# Patient Record
Sex: Female | Born: 1965 | Race: Asian | Hispanic: No | Marital: Married | Smoking: Never smoker
Health system: Southern US, Community
[De-identification: ages and names within clinical notes are randomized; demographics above are authoritative.]

## PROBLEM LIST (undated history)

## (undated) DIAGNOSIS — I10 Essential (primary) hypertension: Secondary | ICD-10-CM

## (undated) DIAGNOSIS — E119 Type 2 diabetes mellitus without complications: Secondary | ICD-10-CM

## (undated) HISTORY — DX: Type 2 diabetes mellitus without complications: E11.9

## (undated) HISTORY — DX: Essential (primary) hypertension: I10

---

## 2017-08-23 HISTORY — PX: CHOLECYSTECTOMY: SHX55

## 2017-08-23 HISTORY — PX: HERNIA REPAIR: SHX51

## 2019-10-23 ENCOUNTER — Other Ambulatory Visit: Payer: Self-pay

## 2019-10-23 ENCOUNTER — Encounter: Payer: Self-pay | Admitting: Family

## 2019-10-23 ENCOUNTER — Ambulatory Visit: Payer: 59 | Admitting: Family

## 2019-10-23 VITALS — BP 160/88 | HR 108 | Temp 96.6°F | Resp 16 | Ht 68.0 in | Wt 236.0 lb

## 2019-10-23 DIAGNOSIS — R1012 Left upper quadrant pain: Secondary | ICD-10-CM | POA: Diagnosis not present

## 2019-10-23 DIAGNOSIS — R0789 Other chest pain: Secondary | ICD-10-CM | POA: Diagnosis not present

## 2019-10-23 DIAGNOSIS — I10 Essential (primary) hypertension: Secondary | ICD-10-CM | POA: Diagnosis not present

## 2019-10-23 LAB — CBC WITH DIFFERENTIAL/PLATELET
Basophils Absolute: 0.1 10*3/uL (ref 0.0–0.1)
Basophils Relative: 0.6 % (ref 0.0–3.0)
Eosinophils Absolute: 0.3 10*3/uL (ref 0.0–0.7)
Eosinophils Relative: 2.9 % (ref 0.0–5.0)
HCT: 38.6 % (ref 36.0–46.0)
Hemoglobin: 12.8 g/dL (ref 12.0–15.0)
Lymphocytes Relative: 25.3 % (ref 12.0–46.0)
Lymphs Abs: 2.4 10*3/uL (ref 0.7–4.0)
MCHC: 33.3 g/dL (ref 30.0–36.0)
MCV: 84.3 fl (ref 78.0–100.0)
Monocytes Absolute: 0.6 10*3/uL (ref 0.1–1.0)
Monocytes Relative: 6.3 % (ref 3.0–12.0)
Neutro Abs: 6.3 10*3/uL (ref 1.4–7.7)
Neutrophils Relative %: 64.9 % (ref 43.0–77.0)
Platelets: 239 10*3/uL (ref 150.0–400.0)
RBC: 4.58 Mil/uL (ref 3.87–5.11)
RDW: 15.3 % (ref 11.5–15.5)
WBC: 9.6 10*3/uL (ref 4.0–10.5)

## 2019-10-23 LAB — COMPREHENSIVE METABOLIC PANEL
ALT: 18 U/L (ref 0–35)
AST: 16 U/L (ref 0–37)
Albumin: 4.1 g/dL (ref 3.5–5.2)
Alkaline Phosphatase: 71 U/L (ref 39–117)
BUN: 14 mg/dL (ref 6–23)
CO2: 28 mEq/L (ref 19–32)
Calcium: 9.6 mg/dL (ref 8.4–10.5)
Chloride: 104 mEq/L (ref 96–112)
Creatinine, Ser: 0.78 mg/dL (ref 0.40–1.20)
GFR: 77.28 mL/min (ref >60.00)
Glucose, Bld: 112 mg/dL — ABNORMAL HIGH (ref 70–99)
Potassium: 4.2 mEq/L (ref 3.5–5.1)
Sodium: 141 mEq/L (ref 135–145)
Total Bilirubin: 0.4 mg/dL (ref 0.2–1.2)
Total Protein: 7.6 g/dL (ref 6.0–8.3)

## 2019-10-23 MED ORDER — MELOXICAM 7.5 MG PO TABS: 7.5000 mg | ORAL_TABLET | Freq: Every day | ORAL | 0 refills | Status: DC | PRN

## 2019-10-23 MED ORDER — AMLODIPINE BESYLATE 5 MG PO TABS: 5.0000 mg | ORAL_TABLET | Freq: Every day | ORAL | 5 refills | Status: DC

## 2019-10-23 MED ORDER — METOPROLOL SUCCINATE ER 25 MG PO TB24: 25.0000 mg | ORAL_TABLET | Freq: Every day | ORAL | 3 refills | Status: DC

## 2019-10-23 NOTE — Telephone Encounter (Signed)
Resent to correct pharmacy.

## 2019-10-23 NOTE — Patient Instructions (Signed)
Please continue amlodipine 5mg  for your blood pressure. Add Toprol xl 25mg  once daily for blood pressure. For mild arthritis pain please use tylenol. For more severe pain you may use meloxicam once daily. You should be contacted about scheduling your ultrasound of your abdomen and your referral to cardiology. If you develop severe/worsening chest pressure, please go to the ED.

## 2019-10-23 NOTE — Progress Notes (Signed)
Subjective:    Patient ID: Leslie Morrow, female    DOB: December 14, 1965, 54 y.o.   MRN: [id number]  HPI  Patient is a 54 yr old female who presents today to establish care.  Past medical history is significant for hypertension and obesity.  She reports that she was first diagnosed with hypertension 2 years ago.  She is maintained on amlodipine 5 mg once daily.  BP Readings from Last 3 Encounters:  10/23/19 (!) 160/88   Upon further questioning (see below), the patient states that she does have some chest heaviness with exertion.  She reports that she is short of breath when she climbs hills.  She has no known family history of coronary artery disease.  Notes abdominal fullness LUQ x 1 year, Initially noted only when bending forward, now notices all the time.   Past Medical History:  Diagnosis Date  . Hypertension      Social History   Socioeconomic History  . Marital status: Married    Spouse name: Nile Riggs  . Number of children: 2  . Years of education: Not on file  . Highest education level: Not on file  Occupational History  . Occupation: unemployed  Tobacco Use  . Smoking status: Never Smoker  . Smokeless tobacco: Never Used  Substance and Sexual Activity  . Alcohol use: Never  . Drug use: Never  . Sexual activity: Not Currently  Other Topics Concern  . Not on file  Social History Narrative   Married   2 children son and daughter- both grown, expecting first grandchild in May   She is from [place], moved to [place] age 57   Housewife   Completed HS equivalent in [place]   Social Determinants of Health   Financial Resource Strain:   . Difficulty of Paying Living Expenses: Not on file  Food Insecurity:   . Worried About Charity fundraiser in the Last Year: Not on file  . Ran Out of Food in the Last Year: Not on file  Transportation Needs:   . Lack of Transportation (Medical): Not on file  . Lack of Transportation (Non-Medical): Not on file  Physical Activity:   .  Days of Exercise per Week: Not on file  . Minutes of Exercise per Session: Not on file  Stress:   . Feeling of Stress : Not on file  Social Connections:   . Frequency of Communication with Friends and Family: Not on file  . Frequency of Social Gatherings with Friends and Family: Not on file  . Attends Religious Services: Not on file  . Active Member of Clubs or Organizations: Not on file  . Attends Archivist Meetings: Not on file  . Marital Status: Not on file  Intimate Partner Violence:   . Fear of Current or Ex-Partner: Not on file  . Emotionally Abused: Not on file  . Physically Abused: Not on file  . Sexually Abused: Not on file    Past Surgical History:  Procedure Laterality Date  . CHOLECYSTECTOMY  2019  . HERNIA REPAIR  2019   ventral hernia    Family History  Problem Relation Age of Onset  . Hypertension Mother   . Diabetes Father   . Diabetes Mellitus II Brother     Not on File  Current Outpatient Medications on File Prior to Visit  Medication Sig Dispense Refill  . Calcium Carb-Cholecalciferol [phone] MG-UNIT CAPS Take by mouth.     No current facility-administered medications on file prior  to visit.    BP (!) 160/88 (BP Location: Left Arm, Patient Position: Sitting, Cuff Size: Large)   Pulse (!) 108   Temp (!) 96.6 F (35.9 C) (Temporal)   Resp 16   Ht 5\' 8"  (1.727 m)   Wt 236 lb (107 kg)   SpO2 100%   BMI 35.88 kg/m    Review of Systems  Constitutional: Negative for unexpected weight change.  HENT: Negative for rhinorrhea.        Reports mild daily cough that resolves with allergy medication  Respiratory: Negative for cough and shortness of breath (notes some sob if she walks up a hill).   Cardiovascular: Positive for chest pain (heaviness when she walks).  Gastrointestinal: Negative for constipation and diarrhea.  Genitourinary: Negative for dysuria.  Musculoskeletal: Positive for arthralgias (some left knee pain).  Skin: Negative  for rash.  Neurological: Negative for headaches.  Hematological: Negative for adenopathy.  Psychiatric/Behavioral:       Denies depression/anxiety       Past Medical History:  Diagnosis Date  . Hypertension      Social History   Socioeconomic History  . Marital status: Married    Spouse name:  . Number of children: 2  . Years of education: Not on file  . Highest education level: Not on file  Occupational History  . Occupation: unemployed  Tobacco Use  . Smoking status: Never Smoker  . Smokeless tobacco: Never Used  Substance and Sexual Activity  . Alcohol use: Never  . Drug use: Never  . Sexual activity: Not Currently  Other Topics Concern  . Not on file  Social History Narrative  . Not on file   Social Determinants of Health   Financial Resource Strain:   . Difficulty of Paying Living Expenses: Not on file  Food Insecurity:   . Worried About Laurence Slate in the Last Year: Not on file  . Ran Out of Food in the Last Year: Not on file  Transportation Needs:   . Lack of Transportation (Medical): Not on file  . Lack of Transportation (Non-Medical): Not on file  Physical Activity:   . Days of Exercise per Week: Not on file  . Minutes of Exercise per Session: Not on file  Stress:   . Feeling of Stress : Not on file  Social Connections:   . Frequency of Communication with Friends and Family: Not on file  . Frequency of Social Gatherings with Friends and Family: Not on file  . Attends Religious Services: Not on file  . Active Member of Clubs or Organizations: Not on file  . Attends Programme researcher, broadcasting/film/video Meetings: Not on file  . Marital Status: Not on file  Intimate Partner Violence:   . Fear of Current or Ex-Partner: Not on file  . Emotionally Abused: Not on file  . Physically Abused: Not on file  . Sexually Abused: Not on file    Past Surgical History:  Procedure Laterality Date  . CHOLECYSTECTOMY  2019  . HERNIA REPAIR  2019   ventral hernia     Family History  Problem Relation Age of Onset  . Hypertension Mother   . Diabetes Father     Not on File  Current Outpatient Medications on File Prior to Visit  Medication Sig Dispense Refill  . amLODipine (NORVASC) 5 MG tablet Take 5 mg by mouth daily.    . Calcium Carb-Cholecalciferol [phone] MG-UNIT CAPS Take by mouth.     No current  facility-administered medications on file prior to visit.    BP (!) 160/88 (BP Location: Left Arm, Patient Position: Sitting, Cuff Size: Large)   Pulse (!) 108   Temp (!) 96.6 F (35.9 C) (Temporal)   Resp 16   Ht 5\' 8"  (1.727 m)   Wt 236 lb (107 kg)   SpO2 100%   BMI 35.88 kg/m    Objective:   Physical Exam Constitutional:      Appearance: She is well-developed.  HENT:     Head: Normocephalic and atraumatic.     Right Ear: Tympanic membrane and ear canal normal.     Left Ear: Tympanic membrane and ear canal normal.  Neck:     Thyroid: No thyromegaly.  Cardiovascular:     Rate and Rhythm: Normal rate and regular rhythm.     Heart sounds: Normal heart sounds. No murmur.  Pulmonary:     Effort: Pulmonary effort is normal. No respiratory distress.     Breath sounds: Normal breath sounds. No wheezing.  Abdominal:     Hernia: No hernia is present.     Comments: Mild LUQ tenderness to palpation without guarding. No palpable masses noted.   Musculoskeletal:     Cervical back: Neck supple.     Right lower leg: 1+ Edema present.     Left lower leg: 1+ Edema present.  Skin:    General: Skin is warm and dry.  Neurological:     Mental Status: She is alert and oriented to person, place, and time.  Psychiatric:        Behavior: Behavior normal.        Thought Content: Thought content normal.        Judgment: Judgment normal.           Assessment & Plan:  Hypertension-due to some lower extremity edema, would prefer not to increase amlodipine dose as this may worsen her lower extremity edema.  Instead we will add low-dose  metoprolol 25 mg extended release once daily.  Will obtain complete metabolic panel.  Chest pressure with exertion-I am concerned that this only occurs with exercise.  An EKG is performed today and I have personally interpreted the EKG. Mild ST depression is noted V4-V6.  I do not have another EKG available for comparison. Plan referral to cardiology for further evaluation.   Abdominal discomfort- (LUQ) will obtain abdominal for further evaluation.

## 2019-10-23 NOTE — Telephone Encounter (Signed)
Pt calling to request for meds that was refilled today to be sent to [facility] on  [address]

## 2019-10-24 ENCOUNTER — Telehealth: Payer: Self-pay | Admitting: Family

## 2019-10-24 NOTE — Telephone Encounter (Signed)
Opened in error

## 2019-10-25 ENCOUNTER — Other Ambulatory Visit: Payer: Self-pay

## 2019-10-25 ENCOUNTER — Ambulatory Visit (HOSPITAL_BASED_OUTPATIENT_CLINIC_OR_DEPARTMENT_OTHER)
Admission: RE | Admit: 2019-10-25 | Discharge: 2019-10-25 | Disposition: A | Discharge status: Home / Self Care | Payer: 59 | Source: Ambulatory Visit | Attending: Family | Admitting: Family

## 2019-10-25 DIAGNOSIS — R1012 Left upper quadrant pain: Secondary | ICD-10-CM | POA: Insufficient documentation

## 2019-10-29 ENCOUNTER — Telehealth: Payer: Self-pay | Admitting: Family

## 2019-10-29 NOTE — Telephone Encounter (Signed)
Please contact pt and give her number for cardiology so she can book her appointment.  Looks like the have tried her but I don't see that her appointment has been set up.

## 2019-10-29 NOTE — Telephone Encounter (Signed)
Also, her US shows fatty liver.  She should work on healthy low fat diet, exercise and weight loss.

## 2019-10-29 NOTE — Telephone Encounter (Signed)
Patient advised of results and provider's advise. She verbalized understanding. Phone number to cardiology given to patient to call for appointment

## 2020-02-18 ENCOUNTER — Other Ambulatory Visit: Payer: Self-pay | Admitting: Family

## 2020-03-17 ENCOUNTER — Other Ambulatory Visit: Payer: Self-pay | Admitting: Family

## 2020-03-20 ENCOUNTER — Other Ambulatory Visit: Payer: Self-pay

## 2020-03-20 ENCOUNTER — Ambulatory Visit (INDEPENDENT_AMBULATORY_CARE_PROVIDER_SITE_OTHER): Payer: 59 | Admitting: Family

## 2020-03-20 VITALS — BP 139/96 | HR 96 | Temp 98.6°F | Resp 16 | Ht 68.0 in | Wt 224.0 lb

## 2020-03-20 DIAGNOSIS — I10 Essential (primary) hypertension: Secondary | ICD-10-CM | POA: Diagnosis not present

## 2020-03-20 MED ORDER — LOSARTAN POTASSIUM 25 MG PO TABS: 25.0000 mg | ORAL_TABLET | Freq: Every day | ORAL | 3 refills | Status: DC

## 2020-03-20 NOTE — Patient Instructions (Signed)
Please stop metoprolol. Start losartan 25mg  once daily.

## 2020-03-20 NOTE — Progress Notes (Signed)
Subjective:    Patient ID: Leslie Morrow, female    DOB: Aug 25, 1965, 54 y.o.   MRN: [id number]  HPI   54 yr old female who presents today for follow up.  HTN- She is taking metoprolol but thinks it is making her sad.   BP Readings from Last 3 Encounters:  03/20/20 (!) 139/96  10/23/19 (!) 160/88   Has a New Grandson who has been visiting her. She is very excited about this. Her husband was diagnosed with stage 1 kidney cancer but she states that he had this resected and is expected to do well.   Review of Systems See HPI  Past Medical History:  Diagnosis Date   Hypertension      Social History   Socioeconomic History   Marital status: Married    Spouse name: Event organiser   Number of children: 2   Years of education: Not on file   Highest education level: Not on file  Occupational History   Occupation: unemployed  Tobacco Use   Smoking status: Never Smoker   Smokeless tobacco: Never Used  Substance and Sexual Activity   Alcohol use: Never   Drug use: Never   Sexual activity: Not Currently  Other Topics Concern   Not on file  Social History Narrative   Married   2 children son and daughter- both grown, expecting first grandchild in May   She is from [place], moved to [place] age 56   Housewife   Completed HS equivalent in [place]   Social Determinants of Corporate investment banker Strain:    Difficulty of Paying Living Expenses:   Food Insecurity:    Worried About Programme researcher, broadcasting/film/video in the Last Year:    Barista in the Last Year:   Transportation Needs:    Freight forwarder (Medical):    Lack of Transportation (Non-Medical):   Physical Activity:    Days of Exercise per Week:    Minutes of Exercise per Session:   Stress:    Feeling of Stress :   Social Connections:    Frequency of Communication with Friends and Family:    Frequency of Social Gatherings with Friends and Family:    Attends Religious Services:    Active  Member of Clubs or Organizations:    Attends Engineer, structural:    Marital Status:   Intimate Partner Violence:    Fear of Current or Ex-Partner:    Emotionally Abused:    Physically Abused:    Sexually Abused:     Past Surgical History:  Procedure Laterality Date   CHOLECYSTECTOMY  2019   HERNIA REPAIR  2019   ventral hernia    Family History  Problem Relation Age of Onset   Hypertension Mother    Diabetes Father    Diabetes Mellitus II Brother     Not on File  Current Outpatient Medications on File Prior to Visit  Medication Sig Dispense Refill   amLODipine (NORVASC) 5 MG tablet Take 1 tablet (5 mg total) by mouth daily. 30 tablet 5   Calcium Carb-Cholecalciferol [phone] MG-UNIT CAPS Take by mouth.     meloxicam (MOBIC) 7.5 MG tablet Take 1 tablet (7.5 mg total) by mouth daily as needed for pain. 30 tablet 0   metoprolol succinate (TOPROL-XL) 25 MG 24 hr tablet Take 1 tablet by mouth once daily 14 tablet 0   No current facility-administered medications on file prior to visit.    BP Marland Kitchen)  139/96 (BP Location: Right Arm, Patient Position: Sitting, Cuff Size: Small)    Pulse 96    Temp 98.6 F (37 C) (Oral)    Resp 16    Ht 5\' 8"  (1.727 m)    Wt (!) 224 lb (101.6 kg)    SpO2 100%    BMI 34.06 kg/m       Objective:   Physical Exam Constitutional:      Appearance: She is well-developed.  Cardiovascular:     Rate and Rhythm: Normal rate and regular rhythm.     Heart sounds: Normal heart sounds. No murmur heard.   Pulmonary:     Effort: Pulmonary effort is normal. No respiratory distress.     Breath sounds: Normal breath sounds. No wheezing.  Psychiatric:        Behavior: Behavior normal.        Thought Content: Thought content normal.        Judgment: Judgment normal.           Assessment & Plan:  HTN- bp above goal. She states that she feels her mood is lower when she takes metoprolol. Has some chronic swelling in her ankles, so  she is hesitant to increase amlodipine. Will give trial of losartan. She thinks she had cough with ace inhibitor in the past. Will have her return in 2 weeks for a nurse visit with bmet.  This visit occurred during the SARS-CoV-2 public health emergency.  Safety protocols were in place, including screening questions prior to the visit, additional usage of staff PPE, and extensive cleaning of exam room while observing appropriate contact time as indicated for disinfecting solutions.

## 2020-03-20 NOTE — Addendum Note (Signed)
Addended by: Harley Alto on: 03/20/2020 03:30 PM   Modules accepted: Orders

## 2020-03-21 LAB — BASIC METABOLIC PANEL
BUN: 12 mg/dL (ref 6–23)
CO2: 26 mEq/L (ref 19–32)
Calcium: 9.8 mg/dL (ref 8.4–10.5)
Chloride: 105 mEq/L (ref 96–112)
Creatinine, Ser: 0.86 mg/dL (ref 0.40–1.20)
GFR: 68.94 mL/min (ref >60.00)
Glucose, Bld: 89 mg/dL (ref 70–99)
Potassium: 4 mEq/L (ref 3.5–5.1)
Sodium: 139 mEq/L (ref 135–145)

## 2020-04-03 ENCOUNTER — Other Ambulatory Visit: Payer: Self-pay

## 2020-04-03 ENCOUNTER — Ambulatory Visit (INDEPENDENT_AMBULATORY_CARE_PROVIDER_SITE_OTHER): Payer: 59

## 2020-04-03 DIAGNOSIS — I10 Essential (primary) hypertension: Secondary | ICD-10-CM | POA: Diagnosis not present

## 2020-04-03 NOTE — Progress Notes (Signed)
Pt here for Blood pressure check per Sandford Craze , NP  Pt currently takes: Losartan 25 mg   Pt reports compliance with medication.  BP today @ =136/84 HR = 78 Pt advised per Dr.Copland to continue Losartan and follow up with Melissa in 6 months.

## 2020-04-30 ENCOUNTER — Ambulatory Visit (INDEPENDENT_AMBULATORY_CARE_PROVIDER_SITE_OTHER): Payer: 59 | Admitting: Family

## 2020-04-30 ENCOUNTER — Telehealth: Payer: Self-pay | Admitting: Family

## 2020-04-30 ENCOUNTER — Encounter: Payer: Self-pay | Admitting: Family

## 2020-04-30 ENCOUNTER — Other Ambulatory Visit: Payer: Self-pay

## 2020-04-30 ENCOUNTER — Other Ambulatory Visit (HOSPITAL_COMMUNITY)
Admission: RE | Admit: 2020-04-30 | Discharge: 2020-04-30 | Disposition: A | Discharge status: Home / Self Care | Payer: 59 | Source: Ambulatory Visit | Attending: Family | Admitting: Family

## 2020-04-30 VITALS — BP 135/85 | HR 106 | Temp 98.8°F | Resp 16 | Ht 68.0 in | Wt 230.0 lb

## 2020-04-30 DIAGNOSIS — Z01419 Encounter for gynecological examination (general) (routine) without abnormal findings: Secondary | ICD-10-CM | POA: Diagnosis present

## 2020-04-30 DIAGNOSIS — Z23 Encounter for immunization: Secondary | ICD-10-CM

## 2020-04-30 DIAGNOSIS — Z0000 Encounter for general adult medical examination without abnormal findings: Secondary | ICD-10-CM | POA: Diagnosis not present

## 2020-04-30 DIAGNOSIS — Z1159 Encounter for screening for other viral diseases: Secondary | ICD-10-CM

## 2020-04-30 DIAGNOSIS — Z114 Encounter for screening for human immunodeficiency virus [HIV]: Secondary | ICD-10-CM

## 2020-04-30 NOTE — Telephone Encounter (Signed)
Please initiate cologuard screening test for patient.

## 2020-04-30 NOTE — Progress Notes (Signed)
Subjective:    Patient ID: Leslie Morrow, female    DOB: Mar 19, 1966, 54 y.o.   MRN: [id number]  HPI  Patient is a 54 yr old female who presents today for cpx.  Patient presents today for complete physical.  Immunizations: due for shingrix and flu shot Diet: healthy, not vegetarian Exercise: no currently Colonoscopy: due  LMP 3 yrs ago Pap Smear: 4 yrs ago Mammogram: due    HTN- last visit we added losartan.  BP Readings from Last 3 Encounters:  04/30/20 135/85  03/20/20 (!) 139/96  10/23/19 (!) 160/88   Wt Readings from Last 3 Encounters:  04/30/20 230 lb (104.3 kg)  03/20/20 (!) 224 lb (101.6 kg)  10/23/19 236 lb (107 kg)       Review of Systems  Constitutional: Negative for unexpected weight change.  HENT: Negative for hearing loss and rhinorrhea.   Eyes: Negative for visual disturbance.  Respiratory: Negative for cough and shortness of breath.   Cardiovascular: Negative for chest pain.  Gastrointestinal: Negative for blood in stool, constipation and diarrhea.  Genitourinary: Negative for dysuria and hematuria.  Musculoskeletal: Positive for arthralgias (right shoulder pain- does exercise for this).  Skin: Negative for rash.  Neurological: Negative for headaches.  Hematological: Negative for adenopathy.  Psychiatric/Behavioral:       Denies depression       Past Medical History:  Diagnosis Date  . Hypertension      Social History   Socioeconomic History  . Marital status: Married    Spouse name: Laurence Slate  . Number of children: 2  . Years of education: Not on file  . Highest education level: Not on file  Occupational History  . Occupation: unemployed  Tobacco Use  . Smoking status: Never Smoker  . Smokeless tobacco: Never Used  Substance and Sexual Activity  . Alcohol use: Never  . Drug use: Never  . Sexual activity: Not Currently  Other Topics Concern  . Not on file  Social History Narrative   Married   2 children son and daughter- both  grown, expecting first grandchild in May   She is from [place], moved to [place] age 47   Housewife   Completed HS equivalent in [place]   Social Determinants of Health   Financial Resource Strain:   . Difficulty of Paying Living Expenses: Not on file  Food Insecurity:   . Worried About Programme researcher, broadcasting/film/video in the Last Year: Not on file  . Ran Out of Food in the Last Year: Not on file  Transportation Needs:   . Lack of Transportation (Medical): Not on file  . Lack of Transportation (Non-Medical): Not on file  Physical Activity:   . Days of Exercise per Week: Not on file  . Minutes of Exercise per Session: Not on file  Stress:   . Feeling of Stress : Not on file  Social Connections:   . Frequency of Communication with Friends and Family: Not on file  . Frequency of Social Gatherings with Friends and Family: Not on file  . Attends Religious Services: Not on file  . Active Member of Clubs or Organizations: Not on file  . Attends Banker Meetings: Not on file  . Marital Status: Not on file  Intimate Partner Violence:   . Fear of Current or Ex-Partner: Not on file  . Emotionally Abused: Not on file  . Physically Abused: Not on file  . Sexually Abused: Not on file    Past Surgical History:  Procedure Laterality Date  . CHOLECYSTECTOMY  2019  . HERNIA REPAIR  2019   ventral hernia    Family History  Problem Relation Age of Onset  . Hypertension Mother   . Diabetes Father   . Diabetes Mellitus II Brother     Not on File  Current Outpatient Medications on File Prior to Visit  Medication Sig Dispense Refill  . amLODipine (NORVASC) 5 MG tablet Take 1 tablet (5 mg total) by mouth daily. 30 tablet 5  . Calcium Carb-Cholecalciferol [phone] MG-UNIT CAPS Take by mouth.    . losartan (COZAAR) 25 MG tablet Take 1 tablet (25 mg total) by mouth daily. 30 tablet 3  . meloxicam (MOBIC) 7.5 MG tablet Take 1 tablet (7.5 mg total) by mouth daily as needed for pain. 30 tablet 0    No current facility-administered medications on file prior to visit.    BP 135/85   Pulse (!) 106   Temp 98.8 F (37.1 C) (Oral)   Resp 16   Ht 5\' 8"  (1.727 m)   Wt 230 lb (104.3 kg)   SpO2 100%   BMI 34.97 kg/m    Objective:   Physical Exam Physical Exam  Constitutional: She is oriented to person, place, and time. She appears well-developed and well-nourished. No distress.  HENT:  Head: Normocephalic and atraumatic.  Right Ear: Tympanic membrane and ear canal normal.  Left Ear: Tympanic membrane and ear canal normal.  Mouth/Throat: not examined Eyes: Pupils are equal, round, and reactive to light. No scleral icterus.  Neck: Normal range of motion. No thyromegaly present.  Cardiovascular: Normal rate and regular rhythm.   No murmur heard. Pulmonary/Chest: Effort normal and breath sounds normal. No respiratory distress. He has no wheezes. She has no rales. She exhibits no tenderness.  Abdominal: Soft. Bowel sounds are normal. She exhibits no distension and no mass. There is no tenderness. There is no rebound and no guarding.  Musculoskeletal: She exhibits no edema.  Lymphadenopathy:    She has no cervical adenopathy.  Neurological: She is alert and oriented to person, place, and time. She has normal patellar reflexes. She exhibits normal muscle tone. Coordination normal.  Skin: Skin is warm and dry.  Psychiatric: She has a normal mood and affect. Her behavior is normal. Judgment and thought content normal.  Breasts: Examined lying Right: Without masses, retractions, discharge or axillary adenopathy.  Left: Without masses, retractions, discharge or axillary adenopathy.  Inguinal/mons: Normal without inguinal adenopathy  External genitalia: Normal  BUS/Urethra/Skene's glands: Normal  Bladder: Normal  Vagina: Normal  Cervix: Normal  Uterus: normal in size, shape and contour. Midline and mobile  Adnexa/parametria:  Rt: Without masses or tenderness.  Lt: Without masses  or tenderness.  Anus and perineum: Normal            Assessment & Plan:   Preventative care- discussed diet, exercise, weight loss.  Declines colo but agreeable to cologuard.  Will initiate cologuard. Refer for mammo. Pap performed today.  Obtain routine lab work.  Shingrix #1 and flu shot today.    This visit occurred during the SARS-CoV-2 public health emergency.  Safety protocols were in place, including screening questions prior to the visit, additional usage of staff PPE, and extensive cleaning of exam room while observing appropriate contact time as indicated for disinfecting solutions.          Assessment & Plan:

## 2020-04-30 NOTE — Patient Instructions (Addendum)
Please schedule routine dental and vision exams. Complete lab work prior to leaving. Try to add walking with goal of 30 minutes 5 days a week.  Schedule mammogram on the first floor in imaging. We will work on getting the stool study (cologuard) set up for you.     Preventive Care 37-53 Years Old, Female Preventive care refers to visits with your health care provider and lifestyle choices that can promote health and wellness. This includes:  A yearly physical exam. This may also be called an annual well check.  Regular dental visits and eye exams.  Immunizations.  Screening for certain conditions.  Healthy lifestyle choices, such as eating a healthy diet, getting regular exercise, not using drugs or products that contain nicotine and tobacco, and limiting alcohol use. What can I expect for my preventive care visit? Physical exam Your health care provider will check your:  Height and weight. This may be used to calculate body mass index (BMI), which tells if you are at a healthy weight.  Heart rate and blood pressure.  Skin for abnormal spots. Counseling Your health care provider may ask you questions about your:  Alcohol, tobacco, and drug use.  Emotional well-being.  Home and relationship well-being.  Sexual activity.  Eating habits.  Work and work Astronomer.  Method of birth control.  Menstrual cycle.  Pregnancy history. What immunizations do I need?  Influenza (flu) vaccine  This is recommended every year. Tetanus, diphtheria, and pertussis (Tdap) vaccine  You may need a Td booster every 10 years. Varicella (chickenpox) vaccine  You may need this if you have not been vaccinated. Zoster (shingles) vaccine  You may need this after age 62. Measles, mumps, and rubella (MMR) vaccine  You may need at least one dose of MMR if you were born in 1957 or later. You may also need a second dose. Pneumococcal conjugate (PCV13) vaccine  You may need this if  you have certain conditions and were not previously vaccinated. Pneumococcal polysaccharide (PPSV23) vaccine  You may need one or two doses if you smoke cigarettes or if you have certain conditions. Meningococcal conjugate (MenACWY) vaccine  You may need this if you have certain conditions. Hepatitis A vaccine  You may need this if you have certain conditions or if you travel or work in places where you may be exposed to hepatitis A. Hepatitis B vaccine  You may need this if you have certain conditions or if you travel or work in places where you may be exposed to hepatitis B. Haemophilus influenzae type b (Hib) vaccine  You may need this if you have certain conditions. Human papillomavirus (HPV) vaccine  If recommended by your health care provider, you may need three doses over 6 months. You may receive vaccines as individual doses or as more than one vaccine together in one shot (combination vaccines). Talk with your health care provider about the risks and benefits of combination vaccines. What tests do I need? Blood tests  Lipid and cholesterol levels. These may be checked every 5 years, or more frequently if you are over 72 years old.  Hepatitis C test.  Hepatitis B test. Screening  Lung cancer screening. You may have this screening every year starting at age 61 if you have a 30-pack-year history of smoking and currently smoke or have quit within the past 15 years.  Colorectal cancer screening. All adults should have this screening starting at age 22 and continuing until age 85. Your health care provider may recommend  screening at age 16 if you are at increased risk. You will have tests every 1-10 years, depending on your results and the type of screening test.  Diabetes screening. This is done by checking your blood sugar (glucose) after you have not eaten for a while (fasting). You may have this done every 1-3 years.  Mammogram. This may be done every 1-2 years. Talk with  your health care provider about when you should start having regular mammograms. This may depend on whether you have a family history of breast cancer.  BRCA-related cancer screening. This may be done if you have a family history of breast, ovarian, tubal, or peritoneal cancers.  Pelvic exam and Pap test. This may be done every 3 years starting at age 12. Starting at age 72, this may be done every 5 years if you have a Pap test in combination with an HPV test. Other tests  Sexually transmitted disease (STD) testing.  Bone density scan. This is done to screen for osteoporosis. You may have this scan if you are at high risk for osteoporosis. Follow these instructions at home: Eating and drinking  Eat a diet that includes fresh fruits and vegetables, whole grains, lean protein, and low-fat dairy.  Take vitamin and mineral supplements as recommended by your health care provider.  Do not drink alcohol if: ? Your health care provider tells you not to drink. ? You are pregnant, may be pregnant, or are planning to become pregnant.  If you drink alcohol: ? Limit how much you have to 0-1 drink a day. ? Be aware of how much alcohol is in your drink. In the [place] one drink equals one 12 oz bottle of beer (355 mL), one 5 oz glass of wine (148 mL), or one 1 oz glass of hard liquor (44 mL). Lifestyle  Take daily care of your teeth and gums.  Stay active. Exercise for at least 30 minutes on 5 or more days each week.  Do not use any products that contain nicotine or tobacco, such as cigarettes, e-cigarettes, and chewing tobacco. If you need help quitting, ask your health care provider.  If you are sexually active, practice safe sex. Use a condom or other form of birth control (contraception) in order to prevent pregnancy and STIs (sexually transmitted infections).  If told by your health care provider, take low-dose aspirin daily starting at age 17. What's next?  Visit your health care provider  once a year for a well check visit.  Ask your health care provider how often you should have your eyes and teeth checked.  Stay up to date on all vaccines. This information is not intended to replace advice given to you by your health care provider. Make sure you discuss any questions you have with your health care provider. Document Revised: 04/20/2018 Document Reviewed: 04/20/2018 Elsevier Patient Education  2020 [employer].

## 2020-04-30 NOTE — Telephone Encounter (Signed)
cologuard form faxed to [employer] with insurance card and demographics.

## 2020-04-30 NOTE — Addendum Note (Signed)
Addended by: Wilford Corner on: 04/30/2020 03:09 PM   Modules accepted: Orders

## 2020-05-01 LAB — CYTOLOGY - PAP
Comment: NEGATIVE
Diagnosis: NEGATIVE
High risk HPV: NEGATIVE

## 2020-05-02 ENCOUNTER — Telehealth: Payer: Self-pay | Admitting: Family

## 2020-05-02 NOTE — Telephone Encounter (Signed)
See mychart.  

## 2020-05-03 LAB — BASIC METABOLIC PANEL
BUN: 15 mg/dL (ref 7–25)
CO2: 26 mmol/L (ref 20–32)
Calcium: 9.4 mg/dL (ref 8.6–10.4)
Chloride: 107 mmol/L (ref 98–110)
Creat: 0.9 mg/dL (ref 0.50–1.05)
Glucose, Bld: 113 mg/dL — ABNORMAL HIGH (ref 65–99)
Potassium: 4.4 mmol/L (ref 3.5–5.3)
Sodium: 140 mmol/L (ref 135–146)

## 2020-05-03 LAB — CBC WITH DIFFERENTIAL/PLATELET
Absolute Monocytes: 593 cells/uL (ref 200–950)
Basophils Absolute: 60 cells/uL (ref 0–200)
Basophils Relative: 0.7 %
Eosinophils Absolute: 232 cells/uL (ref 15–500)
Eosinophils Relative: 2.7 %
HCT: 40.8 % (ref 35.0–45.0)
Hemoglobin: 13.3 g/dL (ref 11.7–15.5)
Lymphs Abs: 2262 cells/uL (ref 850–3900)
MCH: 28.1 pg (ref 27.0–33.0)
MCHC: 32.6 g/dL (ref 32.0–36.0)
MCV: 86.3 fL (ref 80.0–100.0)
MPV: 11.9 fL (ref 7.5–12.5)
Monocytes Relative: 6.9 %
Neutro Abs: 5452 cells/uL (ref 1500–7800)
Neutrophils Relative %: 63.4 %
Platelets: 258 10*3/uL (ref 140–400)
RBC: 4.73 10*6/uL (ref 3.80–5.10)
RDW: 14.2 % (ref 11.0–15.0)
Total Lymphocyte: 26.3 %
WBC: 8.6 10*3/uL (ref 3.8–10.8)

## 2020-05-03 LAB — HEPATIC FUNCTION PANEL
AG Ratio: 1.4 (calc) (ref 1.0–2.5)
ALT: 17 U/L (ref 6–29)
AST: 16 U/L (ref 10–35)
Albumin: 4.2 g/dL (ref 3.6–5.1)
Alkaline phosphatase (APISO): 66 U/L (ref 37–153)
Bilirubin, Direct: 0.1 mg/dL (ref 0.0–0.2)
Globulin: 3.1 g/dL (calc) (ref 1.9–3.7)
Indirect Bilirubin: 0.3 mg/dL (calc) (ref 0.2–1.2)
Total Bilirubin: 0.4 mg/dL (ref 0.2–1.2)
Total Protein: 7.3 g/dL (ref 6.1–8.1)

## 2020-05-03 LAB — HIV ANTIBODY (ROUTINE TESTING W REFLEX): HIV 1&2 Ab, 4th Generation: NONREACTIVE

## 2020-05-03 LAB — LIPID PANEL
Cholesterol: 233 mg/dL — ABNORMAL HIGH (ref <200)
HDL: 59 mg/dL (ref >=50)
LDL Cholesterol (Calc): 142 mg/dL (calc) — ABNORMAL HIGH
Non-HDL Cholesterol (Calc): 174 mg/dL (calc) — ABNORMAL HIGH (ref <130)
Total CHOL/HDL Ratio: 3.9 (calc) (ref <5.0)
Triglycerides: 187 mg/dL — ABNORMAL HIGH (ref <150)

## 2020-05-03 LAB — HEMOGLOBIN A1C W/OUT EAG: Hgb A1c MFr Bld: 6 % of total Hgb — ABNORMAL HIGH (ref <5.7)

## 2020-05-03 LAB — HEPATITIS C ANTIBODY
Hepatitis C Ab: NONREACTIVE
SIGNAL TO CUT-OFF: 0.01 (ref <1.00)

## 2020-05-03 LAB — TSH: TSH: 1.33 mIU/L

## 2020-05-04 ENCOUNTER — Encounter: Payer: Self-pay | Admitting: Family

## 2020-05-04 ENCOUNTER — Telehealth: Payer: Self-pay | Admitting: Family

## 2020-05-04 DIAGNOSIS — R7303 Prediabetes: Secondary | ICD-10-CM | POA: Insufficient documentation

## 2020-05-04 HISTORY — DX: Prediabetes: R73.03

## 2020-05-04 NOTE — Telephone Encounter (Signed)
See mychart.  

## 2020-05-08 ENCOUNTER — Telehealth: Payer: Self-pay | Admitting: Family

## 2020-05-08 ENCOUNTER — Ambulatory Visit (HOSPITAL_BASED_OUTPATIENT_CLINIC_OR_DEPARTMENT_OTHER)
Admission: RE | Admit: 2020-05-08 | Discharge: 2020-05-08 | Disposition: A | Discharge status: Home / Self Care | Payer: 59 | Source: Ambulatory Visit | Attending: Family | Admitting: Family

## 2020-05-08 ENCOUNTER — Other Ambulatory Visit: Payer: Self-pay

## 2020-05-08 DIAGNOSIS — Z0000 Encounter for general adult medical examination without abnormal findings: Secondary | ICD-10-CM | POA: Insufficient documentation

## 2020-05-08 DIAGNOSIS — Z1231 Encounter for screening mammogram for malignant neoplasm of breast: Secondary | ICD-10-CM | POA: Diagnosis present

## 2020-05-08 DIAGNOSIS — E348 Other specified endocrine disorders: Secondary | ICD-10-CM

## 2020-05-08 LAB — COLOGUARD: Cologuard: NEGATIVE

## 2020-05-08 NOTE — Telephone Encounter (Signed)
-----   Message from Eugene Garnet sent at 05/08/2020  1:37 PM EDT ----- Regarding: bone density Leslie Morrow has requested that a bone density order to be put in for her.  She just had her mammogram today.  Thank you, Eber Jones

## 2020-05-13 LAB — COLOGUARD: COLOGUARD: NEGATIVE

## 2020-05-13 LAB — EXTERNAL GENERIC LAB PROCEDURE: COLOGUARD: NEGATIVE

## 2020-05-15 ENCOUNTER — Other Ambulatory Visit: Payer: Self-pay

## 2020-05-15 ENCOUNTER — Ambulatory Visit (HOSPITAL_BASED_OUTPATIENT_CLINIC_OR_DEPARTMENT_OTHER)
Admission: RE | Admit: 2020-05-15 | Discharge: 2020-05-15 | Disposition: A | Discharge status: Home / Self Care | Payer: 59 | Source: Ambulatory Visit | Attending: Family | Admitting: Family

## 2020-05-15 DIAGNOSIS — Z0000 Encounter for general adult medical examination without abnormal findings: Secondary | ICD-10-CM | POA: Diagnosis present

## 2020-05-15 DIAGNOSIS — E348 Other specified endocrine disorders: Secondary | ICD-10-CM | POA: Diagnosis not present

## 2020-05-16 ENCOUNTER — Encounter: Payer: Self-pay | Admitting: Family

## 2020-05-16 NOTE — Progress Notes (Signed)
Mailed out to pt 

## 2020-06-02 ENCOUNTER — Other Ambulatory Visit: Payer: Self-pay | Admitting: Family

## 2020-07-02 ENCOUNTER — Other Ambulatory Visit: Payer: Self-pay | Admitting: Family

## 2020-07-03 ENCOUNTER — Other Ambulatory Visit: Payer: Self-pay | Admitting: Family

## 2020-07-03 ENCOUNTER — Ambulatory Visit: Payer: 59

## 2020-07-03 ENCOUNTER — Other Ambulatory Visit: Payer: Self-pay

## 2020-07-03 ENCOUNTER — Ambulatory Visit (INDEPENDENT_AMBULATORY_CARE_PROVIDER_SITE_OTHER): Payer: 59

## 2020-07-03 DIAGNOSIS — Z23 Encounter for immunization: Secondary | ICD-10-CM

## 2020-07-12 ENCOUNTER — Encounter: Payer: Self-pay | Admitting: Family

## 2020-10-31 ENCOUNTER — Encounter: Payer: Self-pay | Admitting: Family

## 2020-10-31 ENCOUNTER — Other Ambulatory Visit: Payer: Self-pay

## 2020-10-31 ENCOUNTER — Ambulatory Visit (INDEPENDENT_AMBULATORY_CARE_PROVIDER_SITE_OTHER): Payer: 59 | Admitting: Family

## 2020-10-31 VITALS — BP 141/87 | HR 92 | Temp 98.1°F | Resp 16 | Ht 68.0 in | Wt 234.0 lb

## 2020-10-31 DIAGNOSIS — R0600 Dyspnea, unspecified: Secondary | ICD-10-CM

## 2020-10-31 DIAGNOSIS — I10 Essential (primary) hypertension: Secondary | ICD-10-CM | POA: Diagnosis not present

## 2020-10-31 DIAGNOSIS — R0609 Other forms of dyspnea: Secondary | ICD-10-CM

## 2020-10-31 LAB — CBC WITH DIFFERENTIAL/PLATELET
Basophils Absolute: 0 10*3/uL (ref 0.0–0.1)
Basophils Relative: 0.7 % (ref 0.0–3.0)
Eosinophils Absolute: 0.2 10*3/uL (ref 0.0–0.7)
Eosinophils Relative: 2.7 % (ref 0.0–5.0)
HCT: 38.5 % (ref 36.0–46.0)
Hemoglobin: 13 g/dL (ref 12.0–15.0)
Lymphocytes Relative: 30.1 % (ref 12.0–46.0)
Lymphs Abs: 2.1 10*3/uL (ref 0.7–4.0)
MCHC: 33.6 g/dL (ref 30.0–36.0)
MCV: 83.1 fl (ref 78.0–100.0)
Monocytes Absolute: 0.5 10*3/uL (ref 0.1–1.0)
Monocytes Relative: 6.5 % (ref 3.0–12.0)
Neutro Abs: 4.2 10*3/uL (ref 1.4–7.7)
Neutrophils Relative %: 60 % (ref 43.0–77.0)
Platelets: 239 10*3/uL (ref 150.0–400.0)
RBC: 4.64 Mil/uL (ref 3.87–5.11)
RDW: 15.9 % — ABNORMAL HIGH (ref 11.5–15.5)
WBC: 7 10*3/uL (ref 4.0–10.5)

## 2020-10-31 LAB — COMPREHENSIVE METABOLIC PANEL
ALT: 21 U/L (ref 0–35)
AST: 20 U/L (ref 0–37)
Albumin: 4.1 g/dL (ref 3.5–5.2)
Alkaline Phosphatase: 68 U/L (ref 39–117)
BUN: 16 mg/dL (ref 6–23)
CO2: 28 mEq/L (ref 19–32)
Calcium: 9.3 mg/dL (ref 8.4–10.5)
Chloride: 105 mEq/L (ref 96–112)
Creatinine, Ser: 0.84 mg/dL (ref 0.40–1.20)
GFR: 79.05 mL/min (ref >60.00)
Glucose, Bld: 107 mg/dL — ABNORMAL HIGH (ref 70–99)
Potassium: 4.2 mEq/L (ref 3.5–5.1)
Sodium: 140 mEq/L (ref 135–145)
Total Bilirubin: 0.4 mg/dL (ref 0.2–1.2)
Total Protein: 7.4 g/dL (ref 6.0–8.3)

## 2020-10-31 LAB — BRAIN NATRIURETIC PEPTIDE: Pro B Natriuretic peptide (BNP): 32 pg/mL (ref 0.0–100.0)

## 2020-10-31 MED ORDER — AMLODIPINE BESYLATE 5 MG PO TABS: 7.5000 mg | ORAL_TABLET | Freq: Every day | ORAL | 1 refills | Status: DC

## 2020-10-31 MED ORDER — LOSARTAN POTASSIUM 25 MG PO TABS: 25.0000 mg | ORAL_TABLET | Freq: Every day | ORAL | 1 refills | Status: DC

## 2020-10-31 NOTE — Patient Instructions (Addendum)
Please complete lab work prior to leaving. You should be contacted about scheduling your appointment for the heart ultrasound. Increase amlodipine to 7.5mg  (1.5 tabs) once daily.

## 2020-10-31 NOTE — Progress Notes (Signed)
Subjective:    Patient ID: Leslie Morrow, female    DOB: 12-Aug-1966, 55 y.o.   MRN: [id number]  HPI  Patient is a 55 yr old female who presents today for follow up.  HTN- maintained on amlodipine 5mg  and losartan 25mg .  BP Readings from Last 3 Encounters:  10/31/20 (!) 141/87  04/30/20 135/85  03/20/20 (!) 139/96   C/o swelling around the eyes in the AM, reports that she has been noticing this the last 3-4 months.  Denies associated itching. Tried taking an antihistamine in the am daily for 1 month without improvement.  States that she also notes recent DOE. Denies LE edema.    Review of Systems    see HPI  Past Medical History:  Diagnosis Date  . Borderline diabetes 05/04/2020  . Hypertension      Social History   Socioeconomic History  . Marital status: Married    Spouse name: 03/22/20  . Number of children: 2  . Years of education: Not on file  . Highest education level: Not on file  Occupational History  . Occupation: unemployed  Tobacco Use  . Smoking status: Never Smoker  . Smokeless tobacco: Never Used  Substance and Sexual Activity  . Alcohol use: Never  . Drug use: Never  . Sexual activity: Not Currently  Other Topics Concern  . Not on file  Social History Narrative   Married   2 children son and daughter- both grown, expecting first grandchild in May   She is from [place], moved to [place] age 66   Housewife   Completed HS equivalent in [place]   Social Determinants of 34 Strain: Not on [place] Insecurity: Not on file  Transportation Needs: Not on file  Physical Activity: Not on file  Stress: Not on file  Social Connections: Not on file  Intimate Partner Violence: Not on file    Past Surgical History:  Procedure Laterality Date  . CHOLECYSTECTOMY  2019  . HERNIA REPAIR  2019   ventral hernia    Family History  Problem Relation Age of Onset  . Hypertension Mother   . Diabetes Father   . Diabetes Mellitus II  Brother     Not on File  Current Outpatient Medications on File Prior to Visit  Medication Sig Dispense Refill  . Calcium Carb-Cholecalciferol [phone] MG-UNIT CAPS Take by mouth.    . meloxicam (MOBIC) 7.5 MG tablet Take 1 tablet (7.5 mg total) by mouth daily as needed for pain. 30 tablet 0   No current facility-administered medications on file prior to visit.    BP (!) 141/87 (BP Location: Right Arm, Patient Position: Sitting, Cuff Size: Large)   Pulse 92   Temp 98.1 F (36.7 C) (Oral)   Resp 16   Ht 5\' 8"  (1.727 m)   Wt 234 lb (106.1 kg)   SpO2 100%   BMI 35.58 kg/m    Objective:   Physical Exam Constitutional:      Appearance: Normal appearance. She is well-developed.  Eyes:      Comments: Mild swelling of upper and lower eyelids.    Small cyst noted right eyelid at location A  Cardiovascular:     Rate and Rhythm: Normal rate and regular rhythm.     Heart sounds: Normal heart sounds. No murmur heard.   Pulmonary:     Effort: Pulmonary effort is normal. No respiratory distress.     Breath sounds: Normal breath sounds. No wheezing.  Neurological:     Mental Status: She is alert.  Psychiatric:        Behavior: Behavior normal.        Thought Content: Thought content normal.        Judgment: Judgment normal.           Assessment & Plan:  HTN- BP is slightly above goal. Will increase amlodipine from 5mg  to 2.5mg  once daily. Continue losartan 25mg . Obtain follow up cmet.  DOE- symptoms concerning for cardiac etiology. Obtain BNP, CBC, CMET, TSH. EKG tracing is personally reviewed.  EKG notes NSR.  No acute changes.   This visit occurred during the SARS-CoV-2 public health emergency.  Safety protocols were in place, including screening questions prior to the visit, additional usage of staff PPE, and extensive cleaning of exam room while observing appropriate contact time as indicated for disinfecting solutions.

## 2020-11-04 NOTE — Addendum Note (Signed)
Addended by: Sandford Craze on: 11/04/2020 12:06 PM   Modules accepted: Orders

## 2020-11-17 ENCOUNTER — Ambulatory Visit: Payer: 59 | Admitting: Family

## 2020-11-17 DIAGNOSIS — Z0289 Encounter for other administrative examinations: Secondary | ICD-10-CM

## 2020-12-08 ENCOUNTER — Other Ambulatory Visit: Payer: Self-pay | Admitting: Family

## 2020-12-24 ENCOUNTER — Ambulatory Visit (HOSPITAL_BASED_OUTPATIENT_CLINIC_OR_DEPARTMENT_OTHER)
Admission: RE | Admit: 2020-12-24 | Discharge: 2020-12-24 | Disposition: A | Discharge status: Home / Self Care | Payer: 59 | Source: Ambulatory Visit | Attending: Family | Admitting: Family

## 2020-12-24 ENCOUNTER — Other Ambulatory Visit: Payer: Self-pay

## 2020-12-24 DIAGNOSIS — R0609 Other forms of dyspnea: Secondary | ICD-10-CM

## 2020-12-24 DIAGNOSIS — R0600 Dyspnea, unspecified: Secondary | ICD-10-CM | POA: Diagnosis not present

## 2020-12-24 LAB — ECHOCARDIOGRAM COMPLETE
AR max vel: 2.53 cm2
AV Area VTI: 2.43 cm2
AV Area mean vel: 2.42 cm2
AV Mean grad: 7.5 mmHg
AV Peak grad: 12.9 mmHg
Ao pk vel: 1.8 m/s
Area-P 1/2: 4.63 cm2
Calc EF: 74.2 %
MV M vel: 1.65 m/s
MV Peak grad: 10.9 mmHg
S' Lateral: 2.64 cm
Single Plane A2C EF: 74.4 %
Single Plane A4C EF: 72.7 %

## 2021-01-14 ENCOUNTER — Other Ambulatory Visit: Payer: Self-pay | Admitting: Family

## 2021-02-19 ENCOUNTER — Other Ambulatory Visit: Payer: Self-pay | Admitting: Family

## 2021-02-20 MED ORDER — LOSARTAN POTASSIUM 25 MG PO TABS
25.0000 mg | ORAL_TABLET | Freq: Every day | ORAL | 0 refills | Status: DC
Start: 2021-02-20 — End: 2021-09-10

## 2021-02-20 MED ORDER — AMLODIPINE BESYLATE 5 MG PO TABS
7.5000 mg | ORAL_TABLET | Freq: Every day | ORAL | 0 refills | Status: DC
Start: 2021-02-20 — End: 2021-08-12

## 2021-02-20 NOTE — Addendum Note (Signed)
Addended by: Mervin Kung A on: 02/20/2021 10:10 AM   Modules accepted: Orders

## 2021-02-25 ENCOUNTER — Encounter: Payer: Self-pay | Admitting: Family

## 2021-02-25 ENCOUNTER — Other Ambulatory Visit: Payer: Self-pay

## 2021-02-25 ENCOUNTER — Ambulatory Visit (INDEPENDENT_AMBULATORY_CARE_PROVIDER_SITE_OTHER): Payer: 59 | Admitting: Family

## 2021-02-25 VITALS — BP 138/84 | HR 79 | Temp 98.6°F | Resp 16 | Wt 220.0 lb

## 2021-02-25 DIAGNOSIS — R7303 Prediabetes: Secondary | ICD-10-CM

## 2021-02-25 DIAGNOSIS — H02849 Edema of unspecified eye, unspecified eyelid: Secondary | ICD-10-CM | POA: Insufficient documentation

## 2021-02-25 DIAGNOSIS — I10 Essential (primary) hypertension: Secondary | ICD-10-CM

## 2021-02-25 DIAGNOSIS — E785 Hyperlipidemia, unspecified: Secondary | ICD-10-CM | POA: Diagnosis not present

## 2021-02-25 DIAGNOSIS — K760 Fatty (change of) liver, not elsewhere classified: Secondary | ICD-10-CM

## 2021-02-25 LAB — COMPREHENSIVE METABOLIC PANEL
ALT: 20 U/L (ref 0–35)
AST: 17 U/L (ref 0–37)
Albumin: 4.1 g/dL (ref 3.5–5.2)
Alkaline Phosphatase: 64 U/L (ref 39–117)
BUN: 16 mg/dL (ref 6–23)
CO2: 27 mEq/L (ref 19–32)
Calcium: 9.8 mg/dL (ref 8.4–10.5)
Chloride: 104 mEq/L (ref 96–112)
Creatinine, Ser: 0.89 mg/dL (ref 0.40–1.20)
GFR: 73.58 mL/min (ref >60.00)
Glucose, Bld: 141 mg/dL — ABNORMAL HIGH (ref 70–99)
Potassium: 4.4 mEq/L (ref 3.5–5.1)
Sodium: 140 mEq/L (ref 135–145)
Total Bilirubin: 0.5 mg/dL (ref 0.2–1.2)
Total Protein: 7 g/dL (ref 6.0–8.3)

## 2021-02-25 LAB — LIPID PANEL
Cholesterol: 213 mg/dL — ABNORMAL HIGH (ref 0–200)
HDL: 53.9 mg/dL (ref >39.00)
LDL Cholesterol: 132 mg/dL — ABNORMAL HIGH (ref 0–99)
NonHDL: 159.01
Total CHOL/HDL Ratio: 4
Triglycerides: 133 mg/dL (ref 0.0–149.0)
VLDL: 26.6 mg/dL (ref 0.0–40.0)

## 2021-02-25 LAB — HEMOGLOBIN A1C: Hgb A1c MFr Bld: 6.5 % (ref 4.6–6.5)

## 2021-02-25 NOTE — Assessment & Plan Note (Signed)
I commended pt on her weight loss success thus far.

## 2021-02-25 NOTE — Progress Notes (Signed)
Subjective:     Patient ID: Leslie Morrow, female    DOB: [birth year], 55 y.o.   MRN: [id number]  No chief complaint on file.   HPI Patient is in today for follow up.  HTN- maintained on 7.52m amlodipine BP Readings from Last 3 Encounters:  02/25/21 138/84  10/31/20 (!) 141/87  04/30/20 135/85   Borderline DM2-  Lab Results  Component Value Date   HGBA1C 6.0 (H) 04/30/2020   Lab Results  Component Value Date   LDLCALC 142 (H) 04/30/2020   CREATININE 0.84 10/31/2020   Morbid obesity- She has been doing intermittent fasting.  Wt Readings from Last 3 Encounters:  02/25/21 220 lb (99.8 kg)  10/31/20 234 lb (106.1 kg)  04/30/20 230 lb (104.3 kg)   She reports some intermittent puffiness of her upper eyelids and brings a photo today.   Health Maintenance Due  Topic Date Due   COVID-19 Vaccine (4 - Booster for Moderna series) 11/17/2020    Past Medical History:  Diagnosis Date   Borderline diabetes 05/04/2020   Hypertension     Past Surgical History:  Procedure Laterality Date   CHOLECYSTECTOMY  2019   HERNIA REPAIR  2019   ventral hernia    Family History  Problem Relation Age of Onset   Hypertension Mother    Diabetes Father    Diabetes Mellitus II Brother     Social History   Socioeconomic History   Marital status: Married    Spouse name: STourist information centre manager  Number of children: 2   Years of education: Not on file   Highest education level: Not on file  Occupational History   Occupation: unemployed  Tobacco Use   Smoking status: Never   Smokeless tobacco: Never  Substance and Sexual Activity   Alcohol use: Never   Drug use: Never   Sexual activity: Not Currently  Other Topics Concern   Not on file  Social History Narrative   Married   2 children son and daughter- both grown, expecting first grandchild in May   [place] [age]  House[place]   [place]  Social Determinants of HSystems developerStrain: Not on fComcastInsecurity: Not on file  Transportation Needs: Not on file  Physical Activity: Not on file  Stress: Not on file  Social Connections: Not on file  Intimate Partner Violence: Not on file    Outpatient Medications Prior to Visit  Medication Sig Dispense Refill   amLODipine (NORVASC) 5 MG tablet Take 1.5 tablets (7.5 mg total) by mouth daily. 10 tablet 0   Calcium Carb-Cholecalciferol [phone] MG-UNIT CAPS Take by mouth.     losartan (COZAAR) 25 MG tablet Take 1 tablet (25 mg total) by mouth daily. 7 tablet 0   meloxicam (MOBIC) 7.5 MG tablet Take 1 tablet (7.5 mg total) by mouth daily as needed for pain. 30 tablet 0   No facility-administered medications prior to visit.    Not on File  ROS    See hpi Objective:    Physical Exam Constitutional:      Appearance: Normal appearance. She is well-developed.  Eyes:     General: Lids are normal.  Cardiovascular:     Rate and Rhythm: Normal rate and regular rhythm.     Heart sounds: Normal heart sounds. No murmur heard. Pulmonary:     Effort: Pulmonary effort is normal. No respiratory distress.  Breath sounds: Normal breath sounds. No wheezing.  Neurological:     Mental Status: She is alert.  Psychiatric:        Behavior: Behavior normal.        Thought Content: Thought content normal.        Judgment: Judgment normal.    BP 138/84 (BP Location: Right Arm, Patient Position: Sitting, Cuff Size: Small)   Pulse 79   Temp 98.6 F (37 C) (Oral)   Resp 16   Wt 220 lb (99.8 kg)   SpO2 100%   BMI 33.45 kg/m  Wt Readings from Last 3 Encounters:  02/25/21 220 lb (99.8 kg)  10/31/20 234 lb (106.1 kg)  04/30/20 230 lb (104.3 kg)       Assessment & Plan:   Problem List Items Addressed This Visit       Unprioritized   Swelling of eyelid    Intermittent. Suspect allergy related. Recommended trial of claritin 88m once daily.        Hypertension    BP at goal. Continue amlodipine  7.535monce daily.        Borderline diabetes    Expect A1C to be better with her recent weight loss.       Relevant Orders   Hemoglobin A1c   Other Visit Diagnoses     Fatty liver    -  Primary   Relevant Orders   Comp Met (CMET)   Hyperlipidemia, unspecified hyperlipidemia type       Relevant Orders   Lipid panel       I am having Anuradha Manner maintain her Calcium Carb-Cholecalciferol, meloxicam, amLODipine, and losartan.  No orders of the defined types were placed in this encounter.

## 2021-02-25 NOTE — Assessment & Plan Note (Signed)
Expect A1C to be better with her recent weight loss.

## 2021-02-25 NOTE — Assessment & Plan Note (Signed)
BP at goal. Continue amlodipine 7.5mg  once daily.

## 2021-02-25 NOTE — Assessment & Plan Note (Signed)
Intermittent. Suspect allergy related. Recommended trial of claritin 10mg  once daily.

## 2021-02-25 NOTE — Patient Instructions (Signed)
Please complete lab work prior to leaving. Try adding claritin 10mg  once daily to see if this helps with the puffiness of your eyelids.

## 2021-02-26 ENCOUNTER — Telehealth: Payer: Self-pay | Admitting: Family

## 2021-02-26 ENCOUNTER — Encounter: Payer: Self-pay | Admitting: Family

## 2021-02-26 DIAGNOSIS — E785 Hyperlipidemia, unspecified: Secondary | ICD-10-CM

## 2021-02-26 DIAGNOSIS — E119 Type 2 diabetes mellitus without complications: Secondary | ICD-10-CM | POA: Insufficient documentation

## 2021-02-26 HISTORY — DX: Hyperlipidemia, unspecified: E78.5

## 2021-02-26 MED ORDER — ATORVASTATIN CALCIUM 10 MG PO TABS: 10.0000 mg | ORAL_TABLET | Freq: Every day | ORAL | 1 refills | Status: DC

## 2021-02-26 NOTE — Telephone Encounter (Signed)
See mychart.  

## 2021-02-27 ENCOUNTER — Other Ambulatory Visit: Payer: Self-pay

## 2021-02-27 MED ORDER — ATORVASTATIN CALCIUM 10 MG PO TABS
10.0000 mg | ORAL_TABLET | Freq: Every day | ORAL | 1 refills | Status: DC
Start: 2021-02-27 — End: 2021-09-28

## 2021-03-30 ENCOUNTER — Other Ambulatory Visit (HOSPITAL_BASED_OUTPATIENT_CLINIC_OR_DEPARTMENT_OTHER): Payer: Self-pay | Admitting: Family

## 2021-03-30 DIAGNOSIS — Z1231 Encounter for screening mammogram for malignant neoplasm of breast: Secondary | ICD-10-CM

## 2021-05-11 ENCOUNTER — Other Ambulatory Visit: Payer: Self-pay

## 2021-05-11 ENCOUNTER — Ambulatory Visit (HOSPITAL_BASED_OUTPATIENT_CLINIC_OR_DEPARTMENT_OTHER)
Admission: RE | Admit: 2021-05-11 | Discharge: 2021-05-11 | Disposition: A | Discharge status: Home / Self Care | Payer: 59 | Source: Ambulatory Visit | Attending: Family | Admitting: Family

## 2021-05-11 ENCOUNTER — Encounter (HOSPITAL_BASED_OUTPATIENT_CLINIC_OR_DEPARTMENT_OTHER): Payer: Self-pay

## 2021-05-11 DIAGNOSIS — Z1231 Encounter for screening mammogram for malignant neoplasm of breast: Secondary | ICD-10-CM | POA: Diagnosis not present

## 2021-05-31 ENCOUNTER — Encounter: Payer: Self-pay | Admitting: Family

## 2021-05-31 ENCOUNTER — Other Ambulatory Visit: Payer: Self-pay | Admitting: Family

## 2021-06-01 MED ORDER — MELOXICAM 7.5 MG PO TABS: 7.5000 mg | ORAL_TABLET | Freq: Every day | ORAL | 0 refills | Status: DC | PRN

## 2021-07-13 ENCOUNTER — Other Ambulatory Visit: Payer: Self-pay

## 2021-07-13 ENCOUNTER — Encounter (HOSPITAL_BASED_OUTPATIENT_CLINIC_OR_DEPARTMENT_OTHER): Payer: Self-pay

## 2021-07-13 ENCOUNTER — Emergency Department (HOSPITAL_BASED_OUTPATIENT_CLINIC_OR_DEPARTMENT_OTHER)
Admission: EM | Admit: 2021-07-13 | Discharge: 2021-07-13 | Disposition: A | Discharge status: Home / Self Care | Payer: 59 | Attending: Emergency Medicine | Admitting: Emergency Medicine

## 2021-07-13 ENCOUNTER — Ambulatory Visit
Admission: EM | Admit: 2021-07-13 | Discharge: 2021-07-13 | Disposition: A | Discharge status: Home / Self Care | Payer: 59

## 2021-07-13 ENCOUNTER — Emergency Department (HOSPITAL_BASED_OUTPATIENT_CLINIC_OR_DEPARTMENT_OTHER): Payer: 59

## 2021-07-13 DIAGNOSIS — S0031XA Abrasion of nose, initial encounter: Secondary | ICD-10-CM | POA: Diagnosis not present

## 2021-07-13 DIAGNOSIS — I10 Essential (primary) hypertension: Secondary | ICD-10-CM | POA: Insufficient documentation

## 2021-07-13 DIAGNOSIS — S0081XA Abrasion of other part of head, initial encounter: Secondary | ICD-10-CM | POA: Diagnosis not present

## 2021-07-13 DIAGNOSIS — S0990XA Unspecified injury of head, initial encounter: Secondary | ICD-10-CM | POA: Insufficient documentation

## 2021-07-13 DIAGNOSIS — S0993XA Unspecified injury of face, initial encounter: Secondary | ICD-10-CM | POA: Diagnosis present

## 2021-07-13 DIAGNOSIS — S00531A Contusion of lip, initial encounter: Secondary | ICD-10-CM | POA: Diagnosis not present

## 2021-07-13 DIAGNOSIS — E119 Type 2 diabetes mellitus without complications: Secondary | ICD-10-CM | POA: Diagnosis not present

## 2021-07-13 DIAGNOSIS — Z79899 Other long term (current) drug therapy: Secondary | ICD-10-CM | POA: Insufficient documentation

## 2021-07-13 DIAGNOSIS — Z23 Encounter for immunization: Secondary | ICD-10-CM | POA: Insufficient documentation

## 2021-07-13 DIAGNOSIS — W01198A Fall on same level from slipping, tripping and stumbling with subsequent striking against other object, initial encounter: Secondary | ICD-10-CM | POA: Insufficient documentation

## 2021-07-13 MED ORDER — MUPIROCIN CALCIUM 2 % EX CREA: 1.0000 "application " | TOPICAL_CREAM | Freq: Two times a day (BID) | CUTANEOUS | 0 refills | Status: DC

## 2021-07-13 MED ORDER — MUPIROCIN 2 % EX OINT: 1.0000 "application " | TOPICAL_OINTMENT | Freq: Two times a day (BID) | CUTANEOUS | 0 refills | Status: DC

## 2021-07-13 MED ORDER — TETANUS-DIPHTH-ACELL PERTUSSIS 5-2.5-18.5 LF-MCG/0.5 IM SUSY
0.5000 mL | PREFILLED_SYRINGE | Freq: Once | INTRAMUSCULAR | Status: AC
  Administered 2021-07-13: 0.5 mL via INTRAMUSCULAR
  Filled 2021-07-13: qty 0.5

## 2021-07-13 MED ORDER — ONDANSETRON 4 MG PO TBDP
4.0000 mg | ORAL_TABLET | Freq: Once | ORAL | Status: AC
  Administered 2021-07-13: 4 mg via ORAL
  Filled 2021-07-13: qty 1

## 2021-07-13 MED ORDER — HYDROCODONE-ACETAMINOPHEN 5-325 MG PO TABS
1.0000 | ORAL_TABLET | Freq: Once | ORAL | Status: AC
  Administered 2021-07-13: 1 via ORAL
  Filled 2021-07-13: qty 1

## 2021-07-13 NOTE — ED Triage Notes (Signed)
Pt reports falling today and hitting her face. Patient has scant active bleeding to her forehead (abrasion), nose (abrasion), patients husband states she has abrasions to her knees. Pt is A&OX 4, ambulatory and denies vision changes.

## 2021-07-13 NOTE — ED Provider Notes (Signed)
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  Patient states she has a lot of burning in her face at this time but is not able to acutely feel any pain.  Patient is unclear exactly what impacted her face when she fell, per my observation there is a significant traumatic injury across the bridge of her nose with deviation of her septum to the left and some mild swelling.  Patient also has significant lacerations on her forehead both hands and knees.  Per my observation, patient appears mildly confused and is not mentating at baseline.  Patient has significantly elevated blood pressure and a pulse of 106.  Patient's husband was advised to take her to the emergency room as I believe she may require imaging of her head.  Patient and husband verbalized agreement and plan to go to the [facility] location now.   Theadora Rama Scales, PA-C 07/13/21 1221

## 2021-07-13 NOTE — ED Notes (Signed)
Patient is being discharged from the Urgent Care and sent to the Emergency Department via POA- with husband. Per L. Romero Liner, patient is in need of higher level of care due to facial abrasion from fall and further evaluation. Patient is aware and verbalizes understanding of plan of care.  Vitals:   07/13/21 1205  BP: (!) 161/93  Pulse: (!) 106  Resp: 18  Temp: 97.8 F (36.6 C)  SpO2: 100%

## 2021-07-13 NOTE — Discharge Instructions (Addendum)
Contact a health care provider if: You received a tetanus shot, and you have swelling, severe pain, redness, or bleeding at your injection site. Your pain is not controlled with medicine. You have a fever. You have any of these signs of infection: Redness, swelling, or more pain around your wound. Warmth coming from your wound. Blood, fluid, pus, or a bad smell coming from your wound. Get help right away if: You have a red streak spreading away from your wound. 

## 2021-07-13 NOTE — ED Triage Notes (Signed)
Patient here POV from Home after Fall.  Patient states she was ambulating outside when she tripped onto [place]. Patient has several abrasions to Bilateral Hands which are minor. Main Abrasion is to Forehead and Nose. Bleeding Controlled at this Time.  NAD Noted during Triage. A&Ox4. GCS 15. Ambulatory. No Neurological Changes Noted. No History of Blood-Thinning Medications.

## 2021-07-13 NOTE — ED Provider Notes (Signed)
MEDCENTER [facility] EMERGENCY DEPT Provider Note   CSN: [id number] Arrival date & time: 07/13/21  1242     History Chief Complaint  Patient presents with   Marletta Lor    Leslie Morrow is a 55 y.o. female.  The history is provided by the patient and medical records. The history is limited by a language barrier. A language interpreter was used (Patient speaks english very well but preferred an Barista).  Facial Injury Mechanism of injury:  Fall Location:  Face, forehead and nose Time since incident:  5 hours Pain details:    Quality:  Aching and pressure   Severity:  Moderate   Timing:  Constant   Progression:  Improving Foreign body present:  No foreign bodies Relieved by:  Nothing Worsened by:  Nothing Ineffective treatments:  None tried Associated symptoms: no altered mental status, no congestion, no difficulty breathing, no double vision, no ear pain, no epistaxis, no headaches, no loss of consciousness, no malocclusion, no nausea, no neck pain, no rhinorrhea, no trismus, no vomiting and no wheezing       Past Medical History:  Diagnosis Date   Diabetes type 2, controlled (HCC)    Hyperlipidemia 02/26/2021   Hypertension     Patient Active Problem List   Diagnosis Date Noted   Diabetes type 2, controlled (HCC) 02/26/2021   Hyperlipidemia 02/26/2021   Swelling of eyelid 02/25/2021   Hypertension 10/23/2019   Morbid obesity (HCC) 10/23/2019    Past Surgical History:  Procedure Laterality Date   CHOLECYSTECTOMY  2019   HERNIA REPAIR  2019   ventral hernia     OB History   No obstetric history on file.     Family History  Problem Relation Age of Onset   Hypertension Mother    Diabetes Father    Diabetes Mellitus II Brother     Social History   Tobacco Use   Smoking status: Never   Smokeless tobacco: Never  Substance Use Topics   Alcohol use: Never   Drug use: Never    Home Medications Prior to Admission medications   Medication Sig  Start Date End Date Taking? Authorizing Provider  mupirocin ointment (BACTROBAN) 2 % Apply 1 application topically 2 (two) times daily. 07/13/21  Yes Osceola Depaz, PA-C  amLODipine (NORVASC) 5 MG tablet Take 1.5 tablets (7.5 mg total) by mouth daily. 02/20/21   Sandford Craze, NP  atorvastatin (LIPITOR) 10 MG tablet Take 1 tablet (10 mg total) by mouth daily. 02/27/21   Sandford Craze, NP  Calcium Carb-Cholecalciferol [phone] MG-UNIT CAPS Take by mouth.    [provider]  losartan (COZAAR) 25 MG tablet Take 1 tablet (25 mg total) by mouth daily. 02/20/21   Sandford Craze, NP  meloxicam (MOBIC) 7.5 MG tablet Take 1 tablet (7.5 mg total) by mouth daily as needed for pain. 06/01/21   Sandford Craze, NP    Allergies    Naproxen sodium  Review of Systems   Review of Systems  HENT:  Positive for facial swelling. Negative for congestion, dental problem, ear pain, nosebleeds and rhinorrhea.   Eyes:  Negative for double vision and pain.  Respiratory:  Negative for wheezing.   Gastrointestinal:  Negative for nausea and vomiting.  Musculoskeletal:  Negative for neck pain.  Skin:  Positive for wound.  Neurological:  Negative for dizziness, loss of consciousness, syncope and headaches.   Physical Exam Updated Vital Signs BP 137/88 (BP Location: Right Arm)   Pulse 90   Temp 97.7 F (  36.5 C) (Oral)   Resp 16   Ht 5\' 8"  (1.727 m)   Wt 99.8 kg   SpO2 100%   BMI 33.45 kg/m   Physical Exam Vitals and nursing note reviewed.  Constitutional:      General: She is not in acute distress.    Appearance: She is well-developed. She is not diaphoretic.  HENT:     Head: Normocephalic.     Comments: Abrasion to the forehead, nasal bridge, and lip Area of tissue avulsion over the nasal bridge no epistaxis    Right Ear: External ear normal.     Left Ear: External ear normal.     Nose: Nose normal. No congestion.     Mouth/Throat:     Mouth: Mucous membranes are moist.      Comments: Brusing and abrasion to the upper lip Tooth lac to the mucosal surface- NOT through and through Eyes:     General: No scleral icterus.    Conjunctiva/sclera: Conjunctivae normal.  Cardiovascular:     Rate and Rhythm: Normal rate and regular rhythm.     Heart sounds: Normal heart sounds. No murmur heard.   No friction rub. No gallop.  Pulmonary:     Effort: Pulmonary effort is normal. No respiratory distress.     Breath sounds: Normal breath sounds.  Abdominal:     General: Bowel sounds are normal. There is no distension.     Palpations: Abdomen is soft. There is no mass.     Tenderness: There is no abdominal tenderness. There is no guarding.  Musculoskeletal:     Cervical back: Normal range of motion.  Skin:    General: Skin is warm and dry.  Neurological:     Mental Status: She is alert and oriented to person, place, and time.  Psychiatric:        Behavior: Behavior normal.    ED Results / Procedures / Treatments   Labs (all labs ordered are listed, but only abnormal results are displayed) Labs Reviewed - No data to display  EKG None  Radiology CT Head Wo Contrast  Result Date: 07/13/2021 CLINICAL DATA:  Trip and fall, facial trauma EXAM: CT HEAD WITHOUT CONTRAST CT MAXILLOFACIAL WITHOUT CONTRAST CT CERVICAL SPINE WITHOUT CONTRAST TECHNIQUE: Multidetector CT imaging of the head, cervical spine, and maxillofacial structures were performed using the standard protocol without intravenous contrast. Multiplanar CT image reconstructions of the cervical spine and maxillofacial structures were also generated. COMPARISON:  None. FINDINGS: CT HEAD FINDINGS Brain: No evidence of acute infarction, hemorrhage, hydrocephalus, extra-axial collection or mass lesion/mass effect. Vascular: No hyperdense vessel or unexpected calcification. CT FACIAL BONES FINDINGS Skull: Normal. Negative for fracture or focal lesion. Facial bones: No displaced fractures or dislocations. Sinuses/Orbits:  No acute finding. Other: None. CT CERVICAL SPINE FINDINGS Alignment: Normal. Skull base and vertebrae: No acute fracture. No primary bone lesion or focal pathologic process. Soft tissues and spinal canal: No prevertebral fluid or swelling. No visible canal hematoma. Disc levels: Moderate disc space height loss and osteophytosis of the lower cervical levels. Upper chest: Negative. Other: Low-attenuation nodule of the right lobe of the thyroid with an eccentric calcification measuring 3.0 cm. IMPRESSION: 1. No acute intracranial pathology. 2. No displaced fractures or dislocations of the facial bones. 3. No fracture or static subluxation of the cervical spine. 4. Low-attenuation nodule of the right lobe of the thyroid with an eccentric calcification measuring 3.0 cm. 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 2015 Feb;12(2): 143-50). Electronically Signed   By: Jearld Lesch M.D.   On: 07/13/2021 15:32   CT Cervical Spine Wo Contrast  Result Date: 07/13/2021 CLINICAL DATA:  Trip and fall, facial trauma EXAM: CT HEAD WITHOUT CONTRAST CT MAXILLOFACIAL WITHOUT CONTRAST CT CERVICAL SPINE WITHOUT CONTRAST TECHNIQUE: Multidetector CT imaging of the head, cervical spine, and maxillofacial structures were performed using the standard protocol without intravenous contrast. Multiplanar CT image reconstructions of the cervical spine and maxillofacial structures were also generated. COMPARISON:  None. FINDINGS: CT HEAD FINDINGS Brain: No evidence of acute infarction, hemorrhage, hydrocephalus, extra-axial collection or mass lesion/mass effect. Vascular: No hyperdense vessel or unexpected calcification. CT FACIAL BONES FINDINGS Skull: Normal. Negative for fracture or focal lesion. Facial bones: No displaced fractures or dislocations. Sinuses/Orbits: No acute finding. Other: None. CT CERVICAL SPINE  FINDINGS Alignment: Normal. Skull base and vertebrae: No acute fracture. No primary bone lesion or focal pathologic process. Soft tissues and spinal canal: No prevertebral fluid or swelling. No visible canal hematoma. Disc levels: Moderate disc space height loss and osteophytosis of the lower cervical levels. Upper chest: Negative. Other: Low-attenuation nodule of the right lobe of the thyroid with an eccentric calcification measuring 3.0 cm. IMPRESSION: 1. No acute intracranial pathology. 2. No displaced fractures or dislocations of the facial bones. 3. No fracture or static subluxation of the cervical spine. 4. Low-attenuation nodule of the right lobe of the thyroid with an eccentric calcification measuring 3.0 cm. 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 2015 Feb;12(2): 143-50). Electronically Signed   By: Jearld Lesch M.D.   On: 07/13/2021 15:32   CT Maxillofacial Wo Contrast  Result Date: 07/13/2021 CLINICAL DATA:  Trip and fall, facial trauma EXAM: CT HEAD WITHOUT CONTRAST CT MAXILLOFACIAL WITHOUT CONTRAST CT CERVICAL SPINE WITHOUT CONTRAST TECHNIQUE: Multidetector CT imaging of the head, cervical spine, and maxillofacial structures were performed using the standard protocol without intravenous contrast. Multiplanar CT image reconstructions of the cervical spine and maxillofacial structures were also generated. COMPARISON:  None. FINDINGS: CT HEAD FINDINGS Brain: No evidence of acute infarction, hemorrhage, hydrocephalus, extra-axial collection or mass lesion/mass effect. Vascular: No hyperdense vessel or unexpected calcification. CT FACIAL BONES FINDINGS Skull: Normal. Negative for fracture or focal lesion. Facial bones: No displaced fractures or dislocations. Sinuses/Orbits: No acute finding. Other: None. CT CERVICAL SPINE FINDINGS Alignment: Normal. Skull base and  vertebrae: No acute fracture. No primary bone lesion or focal pathologic process. Soft tissues and spinal canal: No prevertebral fluid or swelling. No visible canal hematoma. Disc levels: Moderate disc space height loss and osteophytosis of the lower cervical levels. Upper chest: Negative. Other: Low-attenuation nodule of the right lobe of the thyroid with an eccentric calcification measuring 3.0 cm. IMPRESSION: 1. No acute intracranial pathology. 2. No displaced fractures or dislocations of the facial bones. 3. No fracture or static subluxation of the cervical spine. 4. Low-attenuation nodule of the right lobe of the thyroid with an eccentric calcification measuring 3.0 cm. 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 2015 Feb;12(2): 143-50). Electronically Signed   By: Jearld Lesch M.D.   On: 07/13/2021 15:32    Procedures Procedures   Medications Ordered in ED Medications  HYDROcodone-acetaminophen (NORCO/VICODIN) 5-325 MG  per tablet 1 tablet (1 tablet Oral Given 07/13/21 1605)  ondansetron (ZOFRAN-ODT) disintegrating tablet 4 mg (4 mg Oral Given 07/13/21 1605)  Tdap (BOOSTRIX) injection 0.5 mL (0.5 mLs Intramuscular Given 07/13/21 1609)    ED Course  I have reviewed the triage vital signs and the nursing notes.  Pertinent labs & imaging results that were available during my care of the patient were reviewed by me and considered in my medical decision making (see chart for details).    MDM Rules/Calculators/A&P                           Patient here with injury to the face. No evidence of Fracture or acute abnormality on CT head/c/spine, or maxillofacial.  Patient t dap updated. Patient will be discharged with bactroban. Discussed OP f/u. Final Clinical Impression(s) / ED Diagnoses Final diagnoses:  Facial abrasion, initial encounter    Rx / DC Orders ED  Discharge Orders          Ordered    mupirocin cream (BACTROBAN) 2 %  2 times daily,   Status:  Discontinued        07/13/21 1623    mupirocin ointment (BACTROBAN) 2 %  2 times daily        07/13/21 1624             Arthor Captain, PA-C 07/14/21 1904    Pricilla Loveless, MD 07/15/21 1646

## 2021-08-12 ENCOUNTER — Other Ambulatory Visit: Payer: Self-pay | Admitting: Family

## 2021-08-12 ENCOUNTER — Encounter (HOSPITAL_COMMUNITY): Payer: Self-pay | Admitting: Radiology

## 2021-08-23 DIAGNOSIS — E041 Nontoxic single thyroid nodule: Secondary | ICD-10-CM

## 2021-08-23 HISTORY — DX: Nontoxic single thyroid nodule: E04.1

## 2021-08-23 HISTORY — PX: BIOPSY THYROID: PRO38

## 2021-09-09 ENCOUNTER — Other Ambulatory Visit: Payer: Self-pay | Admitting: Family

## 2021-09-10 ENCOUNTER — Encounter: Payer: Self-pay | Admitting: Family

## 2021-09-10 MED ORDER — LOSARTAN POTASSIUM 25 MG PO TABS: 25.0000 mg | ORAL_TABLET | Freq: Every day | ORAL | 0 refills | Status: DC

## 2021-09-14 ENCOUNTER — Ambulatory Visit (INDEPENDENT_AMBULATORY_CARE_PROVIDER_SITE_OTHER): Payer: 59 | Admitting: Family

## 2021-09-14 VITALS — BP 139/73 | HR 94 | Temp 98.4°F | Resp 16 | Ht 68.0 in | Wt 224.6 lb

## 2021-09-14 DIAGNOSIS — E041 Nontoxic single thyroid nodule: Secondary | ICD-10-CM | POA: Diagnosis not present

## 2021-09-14 DIAGNOSIS — R21 Rash and other nonspecific skin eruption: Secondary | ICD-10-CM | POA: Insufficient documentation

## 2021-09-14 DIAGNOSIS — E119 Type 2 diabetes mellitus without complications: Secondary | ICD-10-CM

## 2021-09-14 DIAGNOSIS — E785 Hyperlipidemia, unspecified: Secondary | ICD-10-CM

## 2021-09-14 DIAGNOSIS — I10 Essential (primary) hypertension: Secondary | ICD-10-CM | POA: Diagnosis not present

## 2021-09-14 LAB — LIPID PANEL
Cholesterol: 145 mg/dL (ref 0–200)
HDL: 55.7 mg/dL (ref >39.00)
LDL Cholesterol: 65 mg/dL (ref 0–99)
NonHDL: 89.09
Total CHOL/HDL Ratio: 3
Triglycerides: 119 mg/dL (ref 0.0–149.0)
VLDL: 23.8 mg/dL (ref 0.0–40.0)

## 2021-09-14 LAB — COMPREHENSIVE METABOLIC PANEL
ALT: 17 U/L (ref 0–35)
AST: 17 U/L (ref 0–37)
Albumin: 4 g/dL (ref 3.5–5.2)
Alkaline Phosphatase: 57 U/L (ref 39–117)
BUN: 14 mg/dL (ref 6–23)
CO2: 28 mEq/L (ref 19–32)
Calcium: 9.3 mg/dL (ref 8.4–10.5)
Chloride: 107 mEq/L (ref 96–112)
Creatinine, Ser: 0.77 mg/dL (ref 0.40–1.20)
GFR: 87.21 mL/min (ref >60.00)
Glucose, Bld: 122 mg/dL — ABNORMAL HIGH (ref 70–99)
Potassium: 3.7 mEq/L (ref 3.5–5.1)
Sodium: 142 mEq/L (ref 135–145)
Total Bilirubin: 0.4 mg/dL (ref 0.2–1.2)
Total Protein: 6.8 g/dL (ref 6.0–8.3)

## 2021-09-14 LAB — TSH: TSH: 1.58 u[IU]/mL (ref 0.35–5.50)

## 2021-09-14 LAB — T3, FREE: T3, Free: 2.4 pg/mL (ref 2.3–4.2)

## 2021-09-14 LAB — HEMOGLOBIN A1C: Hgb A1c MFr Bld: 6.2 % (ref 4.6–6.5)

## 2021-09-14 LAB — T4, FREE: Free T4: 1.02 ng/dL (ref 0.60–1.60)

## 2021-09-14 MED ORDER — AMLODIPINE BESYLATE 5 MG PO TABS: 5.0000 mg | ORAL_TABLET | Freq: Every day | ORAL | 0 refills | Status: DC

## 2021-09-14 MED ORDER — CLOTRIMAZOLE-BETAMETHASONE 1-0.05 % EX CREA: 1.0000 "application " | TOPICAL_CREAM | Freq: Two times a day (BID) | CUTANEOUS | 0 refills | Status: DC

## 2021-09-14 NOTE — Assessment & Plan Note (Signed)
Maintained on atorvastatin 10mg . Will obtain follow up lipid panel.

## 2021-09-14 NOTE — Assessment & Plan Note (Signed)
She had DM eye exam completed in [place] last year. Reminded her about the importance of annual DM eye exam.  Obtain follow up A1C. Recommended that she get the new COVID bivalent booster.

## 2021-09-14 NOTE — Assessment & Plan Note (Signed)
New.  Will obtain TFT's and thyroid US for further evaluation.

## 2021-09-14 NOTE — Addendum Note (Signed)
Addended by: Debbrah Alar on: 09/14/2021 11:49 AM   Modules accepted: Orders

## 2021-09-14 NOTE — Progress Notes (Addendum)
Subjective:     Patient ID: Leslie Morrow, female    DOB: [birth year], 56 y.o.   MRN: [id number]  Chief Complaint  Patient presents with   Hypertension    Here for follow up    Fatty liver   Thyroid Nodule    Thyroid nodule found on CT In November    Hypertension  Patient is in today for follow up.  HTN- maintained on losartan $RemoveBef'25mg'blqwXuZFHi$  and amlodipine 7.$RemoveBeforeDE'5mg'aYXqPepbUhVHnbM$ . She felt dizzy on 7.5 and returned to $RemoveBef'5mg'WKTnLaVZVR$ .  BP Readings from Last 3 Encounters:  09/14/21 139/73  07/13/21 137/88  02/25/21 138/84   Hyperlipidemia-  Lab Results  Component Value Date   CHOL 213 (H) 02/25/2021   HDL 53.90 02/25/2021   LDLCALC 132 (H) 02/25/2021   TRIG 133.0 02/25/2021   CHOLHDL 4 02/25/2021   Pt is maintained on atorvastatin $RemoveBeforeD'10mg'mlRqdLHgKgtoNw$ .   Fatty liver-  Thyroid Nodule- incidental finding on CT performed following a fall in the end of November:  Last eye exam in [place] 1 year ago.    Low-attenuation nodule of the right lobe of the thyroid with an eccentric calcification measuring 3.0 cm. 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 however recommend dedicated thyroid ultrasound on a nonemergent outpatient basis to further evaluate   Health Maintenance Due  Topic Date Due   OPHTHALMOLOGY EXAM  Never done   COVID-19 Vaccine (3 - Booster for Moderna series) 09/14/2020   INFLUENZA VACCINE  03/23/2021   HEMOGLOBIN A1C  08/28/2021    Past Medical History:  Diagnosis Date   Diabetes type 2, controlled ([place])    Hyperlipidemia 02/26/2021   Hypertension     Past Surgical History:  Procedure Laterality Date   CHOLECYSTECTOMY  2019   HERNIA REPAIR  2019   ventral hernia    Family History  Problem Relation Age of Onset   Hypertension Mother    Diabetes Father    Diabetes Mellitus II Brother     Social History   Socioeconomic History   Marital status: Married    Spouse name: Tourist information centre manager   Number of children: 2   Years of education: Not on file   Highest education level: Not on file   Occupational History   Occupation: unemployed  Tobacco Use   Smoking status: Never   Smokeless tobacco: Never  Substance and Sexual Activity   Alcohol use: Never   Drug use: Never   Sexual activity: Not Currently  Other Topics Concern   Not on file  Social History Narrative   Married   2 children son and daughter- both grown, expecting first grandchild in May   She is from [place], moved to [place] age 89   Housewife   Completed HS equivalent in [place]   Social Determinants of Radio broadcast assistant Strain: Not on [employer] Insecurity: Not on file  Transportation Needs: Not on file  Physical Activity: Not on file  Stress: Not on file  Social Connections: Not on file  Intimate Partner Violence: Not on file    Outpatient Medications Prior to Visit  Medication Sig Dispense Refill   atorvastatin (LIPITOR) 10 MG tablet Take 1 tablet (10 mg total) by mouth daily. 90 tablet 1   Calcium Carb-Cholecalciferol [phone] MG-UNIT CAPS Take by mouth.     losartan (COZAAR) 25 MG tablet Take 1 tablet (25 mg total) by mouth daily. 30 tablet 0   meloxicam (MOBIC) 7.5 MG tablet Take 1 tablet (7.5 mg total) by mouth daily as  needed for pain. 30 tablet 0   mupirocin ointment (BACTROBAN) 2 % Apply 1 application topically 2 (two) times daily. 22 g 0   amLODipine (NORVASC) 5 MG tablet TAKE 1 & 1/2 (ONE & ONE-HALF) TABLETS BY MOUTH ONCE DAILY 135 tablet 0   No facility-administered medications prior to visit.    Allergies  Allergen Reactions   Naproxen Sodium     Other reaction(s): Other (See Comments) heartburn heartburn heartburn     ROS     Objective:    Physical Exam Constitutional:      General: She is not in acute distress.    Appearance: Normal appearance. She is well-developed.  HENT:     Head: Normocephalic and atraumatic.     Right Ear: External ear normal.     Left Ear: External ear normal.  Eyes:     General: No scleral icterus. Neck:     Thyroid: No  thyromegaly.     Comments: Right thyroid enlargement noted Cardiovascular:     Rate and Rhythm: Normal rate and regular rhythm.     Heart sounds: Normal heart sounds. No murmur heard. Pulmonary:     Effort: Pulmonary effort is normal. No respiratory distress.     Breath sounds: Normal breath sounds. No wheezing.  Musculoskeletal:     Cervical back: Neck supple.  Skin:    General: Skin is warm and dry.     Comments: Some small pink annular lesions noted on right shin (appear fungal)  Neurological:     Mental Status: She is alert and oriented to person, place, and time.  Psychiatric:        Mood and Affect: Mood normal.        Behavior: Behavior normal.        Thought Content: Thought content normal.        Judgment: Judgment normal.    BP 139/73 (BP Location: Right Arm, Patient Position: Sitting, Cuff Size: Large)    Pulse 94    Temp 98.4 F (36.9 C) (Oral)    Resp 16    Ht _0  (1.727 m)    Wt 224 lb 9.6 oz (101.9 kg)    SpO2 100%    BMI 34.15 kg/m  Wt Readings from Last 3 Encounters:  09/14/21 224 lb 9.6 oz (101.9 kg)  07/13/21 220 lb 0.3 oz (99.8 kg)  02/25/21 220 lb (99.8 kg)      Diabetic Foot Exam - Simple   Simple Foot Form Diabetic Foot exam was performed with the following findings: Yes 09/14/2021  8:04 AM  Visual Inspection No deformities, no ulcerations, no other skin breakdown bilaterally: Yes Sensation Testing Intact to touch and monofilament testing bilaterally: Yes Pulse Check Posterior Tibialis and Dorsalis pulse intact bilaterally: Yes Comments     Assessment & Plan:   Problem List Items Addressed This Visit       Unprioritized   Thyroid nodule    New.  Will obtain TFT's and thyroid US for further evaluation.      Relevant Orders   US THYROID   TSH   T3, free   T4, free   Hypertension    BP is acceptable. Could not tolerate 7.48m of amlodipine- feels better on 530m Continue 56m656mlong with losartan 256m9m     Relevant Medications    amLODipine (NORVASC) 5 MG tablet   Hyperlipidemia    Maintained on atorvastatin 10mg4mll obtain follow up lipid panel.  Relevant Medications   amLODipine (NORVASC) 5 MG tablet   Other Relevant Orders   Lipid panel   Comp Met (CMET)   Diabetes type 2, controlled ([place]) - Primary    She had DM eye exam completed in [place] last year. Reminded her about the importance of annual DM eye exam.  Obtain follow up A1C. Recommended that she get the new COVID bivalent booster.       Relevant Orders   Hemoglobin A1c    I have changed Amariya Czerwinski's amLODipine. I am also having her maintain her Calcium Carb-Cholecalciferol, atorvastatin, meloxicam, mupirocin ointment, and losartan.  Meds ordered this encounter  Medications   amLODipine (NORVASC) 5 MG tablet    Sig: Take 1 tablet (5 mg total) by mouth daily.    Dispense:  135 tablet    Refill:  0    Order Specific Question:   Supervising Provider    Answer:   Penni Homans A [[id number]]

## 2021-09-14 NOTE — Patient Instructions (Addendum)
Please consider getting the new update covid bivalent booster shot.  You should be contacted about scheduling your thyroid ultrasound.   Please complete lab work prior to leaving.

## 2021-09-14 NOTE — Assessment & Plan Note (Signed)
BP is acceptable. Could not tolerate 7.5mg  of amlodipine- feels better on 5mg . Continue 5mg  along with losartan 25mg .

## 2021-09-17 ENCOUNTER — Other Ambulatory Visit: Payer: Self-pay

## 2021-09-17 ENCOUNTER — Ambulatory Visit (HOSPITAL_BASED_OUTPATIENT_CLINIC_OR_DEPARTMENT_OTHER)
Admission: RE | Admit: 2021-09-17 | Discharge: 2021-09-17 | Disposition: A | Discharge status: Home / Self Care | Payer: 59 | Source: Ambulatory Visit | Attending: Family | Admitting: Family

## 2021-09-17 DIAGNOSIS — E041 Nontoxic single thyroid nodule: Secondary | ICD-10-CM | POA: Insufficient documentation

## 2021-09-19 ENCOUNTER — Telehealth: Payer: Self-pay | Admitting: Family

## 2021-09-19 DIAGNOSIS — E041 Nontoxic single thyroid nodule: Secondary | ICD-10-CM

## 2021-09-19 NOTE — Telephone Encounter (Signed)
Reviewed thyroid US results.  Based on the size of the thyroid nodule, the radiologist recommends a biopsy.  I have placed order and she should be contacted about scheduling biopsy.

## 2021-09-21 NOTE — Telephone Encounter (Signed)
Called but no answer, lvm for patient to call back about this message 

## 2021-09-21 NOTE — Telephone Encounter (Signed)
Patient called back and advised of results and recommendations. She verbalized understanding.

## 2021-09-27 ENCOUNTER — Other Ambulatory Visit: Payer: Self-pay | Admitting: Family

## 2021-09-27 ENCOUNTER — Encounter: Payer: Self-pay | Admitting: Family

## 2021-09-28 ENCOUNTER — Other Ambulatory Visit: Payer: Self-pay

## 2021-10-08 ENCOUNTER — Other Ambulatory Visit: Payer: Self-pay | Admitting: Family

## 2021-10-13 ENCOUNTER — Inpatient Hospital Stay: Admission: RE | Admit: 2021-10-13 | Payer: 59 | Source: Ambulatory Visit

## 2021-10-27 ENCOUNTER — Encounter: Payer: Self-pay | Admitting: Family

## 2021-10-27 DIAGNOSIS — E041 Nontoxic single thyroid nodule: Secondary | ICD-10-CM

## 2022-01-03 ENCOUNTER — Other Ambulatory Visit: Payer: Self-pay | Admitting: Family

## 2022-01-12 ENCOUNTER — Other Ambulatory Visit: Payer: Self-pay | Admitting: Family

## 2022-01-12 MED ORDER — LOSARTAN POTASSIUM 25 MG PO TABS: 25.0000 mg | ORAL_TABLET | Freq: Every day | ORAL | 0 refills | Status: DC

## 2022-02-03 ENCOUNTER — Ambulatory Visit (HOSPITAL_COMMUNITY)
Admission: RE | Admit: 2022-02-03 | Discharge: 2022-02-03 | Disposition: A | Discharge status: Home / Self Care | Payer: 59 | Source: Ambulatory Visit | Attending: Family | Admitting: Family

## 2022-02-03 DIAGNOSIS — E041 Nontoxic single thyroid nodule: Secondary | ICD-10-CM | POA: Insufficient documentation

## 2022-02-03 MED ORDER — LIDOCAINE HCL (PF) 1 % IJ SOLN
INTRAMUSCULAR | Status: AC
  Filled 2022-02-03: qty 30

## 2022-02-05 LAB — CYTOLOGY - NON PAP

## 2022-02-17 ENCOUNTER — Telehealth: Payer: Self-pay | Admitting: Family

## 2022-02-17 ENCOUNTER — Encounter (HOSPITAL_COMMUNITY): Payer: Self-pay

## 2022-02-17 ENCOUNTER — Encounter: Payer: Self-pay | Admitting: Family

## 2022-02-17 NOTE — Telephone Encounter (Signed)
Lvm for patient to call back

## 2022-02-17 NOTE — Telephone Encounter (Signed)
Patient notified of results per [facility] CMA

## 2022-02-17 NOTE — Telephone Encounter (Signed)
Please advise pt that her thyroid biopsy shows that her thyroid nodule is not cancerous. Good news.

## 2022-02-21 ENCOUNTER — Other Ambulatory Visit: Payer: Self-pay | Admitting: Family

## 2022-02-22 MED ORDER — LOSARTAN POTASSIUM 25 MG PO TABS: 25.0000 mg | ORAL_TABLET | Freq: Every day | ORAL | 0 refills | Status: DC

## 2022-02-22 NOTE — Progress Notes (Signed)
Good news!  The [facility] result is benign, meaning the patient's risk of cancer is less than 4%.  No surgery at this time.  Will plan to see her back at my office in 1 year with a repeat USN and TSH level.  We discussed this at her office visit.  Tresa Endo - please make arrangements for follow up.  tmg  Darnell Level, MD [facility] practice Office: [phone]

## 2022-02-24 ENCOUNTER — Telehealth: Payer: Self-pay | Admitting: Family

## 2022-02-24 NOTE — Telephone Encounter (Signed)
-----   Message from Darnell Level, MD sent at 02/22/2022 10:47 AM EDT ----- Regarding: Leslie Morrow results on thyroid nodule AFIRMA results are benign.  No need for surgery at this time, possibly never.  I will arrange follow up with me in one year with a repeat USN and TSH level.  Thanks!  tmg  Darnell Level, MD [facility] practice Office: [phone]  ----- Message ----- From: Sandford Craze, NP Sent: 02/19/2022   4:41 PM EDT To: Darnell Level, MD  Dr. Gerrit Friends,   Can you please review her thyroid biopsy/affirma results? I am not sure if these results came to you.  What would be the management for this?  Would it be thyroidectomy?   Thanks,  [employer]

## 2022-03-12 ENCOUNTER — Ambulatory Visit (INDEPENDENT_AMBULATORY_CARE_PROVIDER_SITE_OTHER): Payer: 59 | Admitting: Family

## 2022-03-12 DIAGNOSIS — I10 Essential (primary) hypertension: Secondary | ICD-10-CM | POA: Diagnosis not present

## 2022-03-12 DIAGNOSIS — E785 Hyperlipidemia, unspecified: Secondary | ICD-10-CM

## 2022-03-12 DIAGNOSIS — E041 Nontoxic single thyroid nodule: Secondary | ICD-10-CM | POA: Diagnosis not present

## 2022-03-12 DIAGNOSIS — E119 Type 2 diabetes mellitus without complications: Secondary | ICD-10-CM | POA: Diagnosis not present

## 2022-03-12 LAB — BASIC METABOLIC PANEL
BUN: 22 mg/dL (ref 6–23)
CO2: 28 mEq/L (ref 19–32)
Calcium: 9.6 mg/dL (ref 8.4–10.5)
Chloride: 106 mEq/L (ref 96–112)
Creatinine, Ser: 0.97 mg/dL (ref 0.40–1.20)
GFR: 65.88 mL/min (ref >60.00)
Glucose, Bld: 110 mg/dL — ABNORMAL HIGH (ref 70–99)
Potassium: 4.3 mEq/L (ref 3.5–5.1)
Sodium: 142 mEq/L (ref 135–145)

## 2022-03-12 LAB — HEMOGLOBIN A1C: Hgb A1c MFr Bld: 6.5 % (ref 4.6–6.5)

## 2022-03-12 NOTE — Progress Notes (Signed)
Subjective:   By signing my name below, I, Leslie Morrow, attest that this documentation has been prepared under the direction and in the presence of Leslie Morrow' [place], NP 03/12/2022   Patient ID: Leslie Morrow, female    DOB: 11-May-1966, 56 y.o.   MRN: [id number]  Chief Complaint  Patient presents with   Follow-up    HPI Patient is in today for an office visit  Thyroid - She received a Thyroid Biopsy from Dr. Gerrit Friends and her specialist reports no immediate concerns.  Lab Results  Component Value Date   TSH 1.58 09/14/2021   Blood Sugar - Her A1C levels came down from 6.5 to 6.2. She states that she is maintaining a healthy diet.  Lab Results  Component Value Date   HGBA1C 6.2 09/14/2021   Blood Pressure - As of today's visit, her blood pressure is normal. She is currently taking 1 1/2 tablets of 5 Mg of Amlodipine and 25 Mg of Losartan. BP Readings from Last 3 Encounters:  03/12/22 135/88  09/14/21 139/73  07/13/21 137/88   Pulse Readings from Last 3 Encounters:  03/12/22 95  09/14/21 94  07/13/21 90   Cholesterol - Her cholesterol levels are at goal.  Lab Results  Component Value Date   CHOL 145 09/14/2021   HDL 55.70 09/14/2021   LDLCALC 65 09/14/2021   TRIG 119.0 09/14/2021   CHOLHDL 3 09/14/2021   Vision - She is not UTD on vision exams. She states that her last exam was more than a year ago.  Immunizations - She has not received the Covid-19 booster vaccine.   Health Maintenance Due  Topic Date Due   OPHTHALMOLOGY EXAM  Never done   COVID-19 Vaccine (3 - Moderna series) 09/14/2020    Past Medical History:  Diagnosis Date   Diabetes type 2, controlled (HCC)    Hyperlipidemia 02/26/2021   Hypertension    Thyroid nodule 2023   right thyroid FNA, notes Bethesda III, benign nodule.    Past Surgical History:  Procedure Laterality Date   BIOPSY THYROID Right 2023   benign   CHOLECYSTECTOMY  2019   HERNIA REPAIR  2019   ventral hernia     Family History  Problem Relation Age of Onset   Hypertension Mother    Diabetes Father    Diabetes Mellitus II Brother     Social History   Socioeconomic History   Marital status: Married    Spouse name: Event organiser   Number of children: 2   Years of education: Not on file   Highest education level: Not on file  Occupational History   Occupation: unemployed  Tobacco Use   Smoking status: Never   Smokeless tobacco: Never  Substance and Sexual Activity   Alcohol use: Never   Drug use: Never   Sexual activity: Not Currently  Other Topics Concern   Not on file  Social History Narrative   Married   2 children son and daughter- both grown, expecting first grandchild in May   She is from [place], moved to [place] age 24   Housewife   Completed HS equivalent in [place]   Social Determinants of Corporate investment banker Strain: Not on [employer] Insecurity: Not on file  Transportation Needs: Not on file  Physical Activity: Not on file  Stress: Not on file  Social Connections: Not on file  Intimate Partner Violence: Not on file    Outpatient Medications Prior to Visit  Medication Sig Dispense  Refill   amLODipine (NORVASC) 5 MG tablet TAKE 1 & 1/2 (ONE & ONE-HALF) TABLETS BY MOUTH ONCE DAILY 135 tablet 0   atorvastatin (LIPITOR) 10 MG tablet Take 1 tablet by mouth once daily 90 tablet 0   Calcium Carb-Cholecalciferol [phone] MG-UNIT CAPS Take by mouth.     clotrimazole-betamethasone (LOTRISONE) cream Apply 1 application topically 2 (two) times daily. 30 g 0   losartan (COZAAR) 25 MG tablet Take 1 tablet (25 mg total) by mouth daily. 30 tablet 0   meloxicam (MOBIC) 7.5 MG tablet Take 1 tablet (7.5 mg total) by mouth daily as needed for pain. 30 tablet 0   mupirocin ointment (BACTROBAN) 2 % Apply 1 application topically 2 (two) times daily. 22 g 0   No facility-administered medications prior to visit.    Allergies  Allergen Reactions   Naproxen Sodium     Other  reaction(s): Other (See Comments) heartburn heartburn heartburn     ROS    See HPI Objective:    Physical Exam Constitutional:      General: She is not in acute distress.    Appearance: Normal appearance. She is not ill-appearing.  HENT:     Head: Normocephalic and atraumatic.     Right Ear: External ear normal.     Left Ear: External ear normal.  Eyes:     Extraocular Movements: Extraocular movements intact.     Pupils: Pupils are equal, round, and reactive to light.  Neck:     Comments: Right thyroid fullness Cardiovascular:     Rate and Rhythm: Normal rate and regular rhythm.     Heart sounds: Normal heart sounds. No murmur heard.    No gallop.  Pulmonary:     Effort: Pulmonary effort is normal. No respiratory distress.     Breath sounds: Normal breath sounds. No wheezing or rales.  Skin:    General: Skin is warm and dry.  Neurological:     Mental Status: She is alert and oriented to person, place, and time.  Psychiatric:        Mood and Affect: Mood normal.        Behavior: Behavior normal.        Judgment: Judgment normal.     BP 135/88   Pulse 95   Temp 98.2 F (36.8 C)   Resp 15   Ht 5\' 8"  (1.727 m)   Wt 231 lb (104.8 kg)   SpO2 99%   BMI 35.12 kg/m  Wt Readings from Last 3 Encounters:  03/12/22 231 lb (104.8 kg)  09/14/21 224 lb 9.6 oz (101.9 kg)  07/13/21 220 lb 0.3 oz (99.8 kg)       Assessment & Plan:   Problem List Items Addressed This Visit       Unprioritized   Thyroid nodule    Underwent thyroid biopsy- benign AFIRMA results. Following with Dr. 07/15/21, surgery.  Lab Results  Component Value Date   TSH 1.58 09/14/2021        Hypertension    BP Readings from Last 3 Encounters:  03/12/22 135/88  09/14/21 139/73  07/13/21 137/88  BP stable on amlodipine 5mg  and losartan 25mg  daily. Continue same.        Hyperlipidemia    Lab Results  Component Value Date   CHOL 145 09/14/2021   HDL 55.70 09/14/2021   LDLCALC 65  09/14/2021   TRIG 119.0 09/14/2021   CHOLHDL 3 09/14/2021  Lipids at goal on atorvastatin. Continue same.  Diabetes type 2, controlled (HCC)    Lab Results  Component Value Date   HGBA1C 6.2 09/14/2021   HGBA1C 6.5 02/25/2021   HGBA1C 6.0 (H) 04/30/2020   Lab Results  Component Value Date   LDLCALC 65 09/14/2021   CREATININE 0.77 09/14/2021        Relevant Orders   Hemoglobin A1c   Basic metabolic panel   Ambulatory referral to Ophthalmology     No orders of the defined types were placed in this encounter.   I, Lemont Fillers, NP, personally preformed the services described in this documentation.  All medical record entries made by the scribe were at my direction and in my presence.  I have reviewed the chart and discharge instructions (if applicable) and agree that the record reflects my personal performance and is accurate and complete. 03/12/2022   I,Amber Collins,acting as a Neurosurgeon for Lemont Fillers, NP.,have documented all relevant documentation on the behalf of Lemont Fillers, NP,as directed by  Lemont Fillers, NP while in the presence of Lemont Fillers, NP.   Lemont Fillers, NP

## 2022-03-12 NOTE — Assessment & Plan Note (Signed)
Lab Results  Component Value Date   CHOL 145 09/14/2021   HDL 55.70 09/14/2021   LDLCALC 65 09/14/2021   TRIG 119.0 09/14/2021   CHOLHDL 3 09/14/2021   Lipids at goal on atorvastatin. Continue same.

## 2022-03-12 NOTE — Assessment & Plan Note (Signed)
Lab Results  Component Value Date   HGBA1C 6.2 09/14/2021   HGBA1C 6.5 02/25/2021   HGBA1C 6.0 (H) 04/30/2020   Lab Results  Component Value Date   LDLCALC 65 09/14/2021   CREATININE 0.77 09/14/2021

## 2022-03-12 NOTE — Assessment & Plan Note (Signed)
Underwent thyroid biopsy- benign AFIRMA results. Following with Dr. Gerrit Friends, surgery.  Lab Results  Component Value Date   TSH 1.58 09/14/2021

## 2022-03-12 NOTE — Assessment & Plan Note (Addendum)
BP Readings from Last 3 Encounters:  03/12/22 135/88  09/14/21 139/73  07/13/21 137/88   BP stable on amlodipine 5mg  and losartan 25mg  daily. Continue same.

## 2022-03-19 ENCOUNTER — Other Ambulatory Visit: Payer: Self-pay | Admitting: Family

## 2022-03-19 MED ORDER — LOSARTAN POTASSIUM 25 MG PO TABS: 25.0000 mg | ORAL_TABLET | Freq: Every day | ORAL | 1 refills | Status: DC

## 2022-04-05 ENCOUNTER — Other Ambulatory Visit (HOSPITAL_BASED_OUTPATIENT_CLINIC_OR_DEPARTMENT_OTHER): Payer: Self-pay | Admitting: Family

## 2022-04-05 DIAGNOSIS — Z1231 Encounter for screening mammogram for malignant neoplasm of breast: Secondary | ICD-10-CM

## 2022-04-07 ENCOUNTER — Other Ambulatory Visit: Payer: Self-pay | Admitting: Family

## 2022-04-07 MED ORDER — ATORVASTATIN CALCIUM 10 MG PO TABS: 10.0000 mg | ORAL_TABLET | Freq: Every day | ORAL | 0 refills | Status: DC

## 2022-04-17 ENCOUNTER — Other Ambulatory Visit: Payer: Self-pay | Admitting: Family

## 2022-04-18 ENCOUNTER — Other Ambulatory Visit: Payer: Self-pay | Admitting: Family

## 2022-05-12 ENCOUNTER — Ambulatory Visit (HOSPITAL_BASED_OUTPATIENT_CLINIC_OR_DEPARTMENT_OTHER)
Admission: RE | Admit: 2022-05-12 | Discharge: 2022-05-12 | Disposition: A | Discharge status: Home / Self Care | Payer: 59 | Source: Ambulatory Visit | Attending: Family | Admitting: Family

## 2022-05-12 ENCOUNTER — Encounter (HOSPITAL_BASED_OUTPATIENT_CLINIC_OR_DEPARTMENT_OTHER): Payer: Self-pay

## 2022-05-12 DIAGNOSIS — Z1231 Encounter for screening mammogram for malignant neoplasm of breast: Secondary | ICD-10-CM | POA: Insufficient documentation

## 2022-05-14 ENCOUNTER — Other Ambulatory Visit: Payer: Self-pay | Admitting: Family

## 2022-05-14 DIAGNOSIS — R928 Other abnormal and inconclusive findings on diagnostic imaging of breast: Secondary | ICD-10-CM

## 2022-05-24 ENCOUNTER — Ambulatory Visit
Admission: RE | Admit: 2022-05-24 | Discharge: 2022-05-24 | Disposition: A | Discharge status: Home / Self Care | Payer: 59 | Source: Ambulatory Visit | Attending: Family | Admitting: Family

## 2022-05-24 ENCOUNTER — Other Ambulatory Visit: Payer: Self-pay | Admitting: Family

## 2022-05-24 DIAGNOSIS — R928 Other abnormal and inconclusive findings on diagnostic imaging of breast: Secondary | ICD-10-CM

## 2022-05-24 DIAGNOSIS — N6320 Unspecified lump in the left breast, unspecified quadrant: Secondary | ICD-10-CM

## 2022-05-26 ENCOUNTER — Ambulatory Visit (INDEPENDENT_AMBULATORY_CARE_PROVIDER_SITE_OTHER): Payer: 59 | Admitting: Family

## 2022-05-26 ENCOUNTER — Other Ambulatory Visit: Payer: Self-pay | Admitting: Family

## 2022-05-26 ENCOUNTER — Encounter: Payer: Self-pay | Admitting: Family

## 2022-05-26 VITALS — BP 118/70 | HR 90 | Temp 97.9°F | Resp 18 | Ht 68.0 in | Wt 230.8 lb

## 2022-05-26 DIAGNOSIS — E041 Nontoxic single thyroid nodule: Secondary | ICD-10-CM

## 2022-05-26 DIAGNOSIS — H5711 Ocular pain, right eye: Secondary | ICD-10-CM | POA: Diagnosis not present

## 2022-05-26 MED ORDER — AMLODIPINE BESYLATE 5 MG PO TABS: 7.5000 mg | ORAL_TABLET | Freq: Every day | ORAL | 0 refills | Status: DC

## 2022-05-26 NOTE — Patient Instructions (Signed)
[facility]- [id number]

## 2022-05-26 NOTE — Progress Notes (Signed)
Subjective:     Patient ID: Leslie Morrow, female    DOB: 19-Dec-1965, 56 y.o.   MRN: [id number]  Chief Complaint  Patient presents with   Eye Problem    Right, pt states it feels like something is in her eye. Pt states sensation comes and goes.     Eye Problem     Patient is a 56 yr old female who presents today with chief complaint of right sided eye discomfort. Reports that this has been present for several months.  Denies any known injury to her right eye (except for as a child).  Feels like "sand inside upper eyelid." No improvement with otc eye drops.  Denies any changes in her vision. She has an appointment to establish with an eye doctor but this is not until 2/24.   Thyromegaly- had FNA of thyroid nodule which noted Atypia of undetermined significance.  (Bethesda Category III).  She met with thyroid surgeon Dr. Harlow Asa and reports that plan is for a 1 year follow up.      Health Maintenance Due  Topic Date Due   OPHTHALMOLOGY EXAM  Never done   Diabetic kidney evaluation - Urine ACR  Never done   COLONOSCOPY (Pts 45-74yr Insurance coverage will need to be confirmed)  Never done   COVID-19 Vaccine (3 - Moderna series) 09/14/2020   INFLUENZA VACCINE  03/23/2022    Past Medical History:  Diagnosis Date   [place]    Hyperlipidemia 02/26/2021   Hypertension    Thyroid nodule 2023   right thyroid FNA, notes Bethesda III, benign nodule.    Past Surgical History:  Procedure Laterality Date   BIOPSY THYROID Right 2023   benign   CHOLECYSTECTOMY  2019   HERNIA REPAIR  2019   ventral hernia    Family History  Problem Relation Age of Onset   Hypertension Mother    Diabetes Father    Diabetes Mellitus II Brother     Social History   Socioeconomic History   Marital status: Married    Spouse name: STourist information centre manager  Number of children: 2   Years of education: Not on file   Highest education level: Not on file  Occupational History    Occupation: unemployed  Tobacco Use   Smoking status: Never   Smokeless tobacco: Never  Substance and Sexual Activity   Alcohol use: Never   Drug use: Never   Sexual activity: Not Currently  Other Topics Concern   Not on file  Social History Narrative   Married   2 children son and daughter- both grown, expecting first grandchild in May   [place] [id number]  House[place]   [place]  Social Determinants of HRadio broadcast assistantStrain: Not on fComcastInsecurity: Not on file  Transportation Needs: Not on file  Physical Activity: Not on file  Stress: Not on file  Social Connections: Not on file  Intimate Partner Violence: Not on file    Outpatient Medications Prior to Visit  Medication Sig Dispense Refill   amLODipine (NORVASC) 5 MG tablet Take 1.5 tablets (7.5 mg total) by mouth daily. 135 tablet 0   atorvastatin (LIPITOR) 10 MG tablet Take 1 tablet by mouth once daily 90 tablet 0   Calcium Carb-Cholecalciferol [phone] MG-UNIT CAPS Take by mouth.     clotrimazole-betamethasone (LOTRISONE) cream Apply 1 application topically 2 (two) times daily. 30 g  0   losartan (COZAAR) 25 MG tablet Take 1 tablet (25 mg total) by mouth daily. 90 tablet 1   meloxicam (MOBIC) 7.5 MG tablet Take 1 tablet (7.5 mg total) by mouth daily as needed for pain. 30 tablet 0   mupirocin ointment (BACTROBAN) 2 % Apply 1 application topically 2 (two) times daily. 22 g 0   No facility-administered medications prior to visit.    Allergies  Allergen Reactions   Naproxen Sodium     Other reaction(s): Other (See Comments) heartburn heartburn heartburn     ROS See HPI    Objective:    Physical Exam Constitutional:      Appearance: Normal appearance.  HENT:     Head: Normocephalic.  Eyes:     General: Lids are normal.        Right eye: Hordeolum (small hordeolum noted inside right lower lid) present. No foreign body.        Left eye:  No foreign body.     Extraocular Movements: Extraocular movements intact.     Conjunctiva/sclera: Conjunctivae normal.     Pupils: Pupils are equal, round, and reactive to light.  Neck:     Thyroid: Thyroid mass (right thyroid lobe.) present.  Cardiovascular:     Rate and Rhythm: Normal rate.     Pulses: Normal pulses.  Neurological:     Mental Status: She is alert.     BP 118/70 (BP Location: Left Arm, Patient Position: Sitting, Cuff Size: Large)   Pulse 90   Temp 97.9 F (36.6 C) (Oral)   Resp 18   Ht '5\' 8"'  (1.727 m)   Wt 230 lb 12.8 oz (104.7 kg)   SpO2 98%   BMI 35.09 kg/m  Wt Readings from Last 3 Encounters:  05/26/22 230 lb 12.8 oz (104.7 kg)  03/12/22 231 lb (104.8 kg)  09/14/21 224 lb 9.6 oz (101.9 kg)       Assessment & Plan:   Problem List Items Addressed This Visit       Unprioritized   Thyroid nodule    This is being followed on an annual basis with Dr. Harlow Asa.       Discomfort of right eye - Primary    New, x 2 months. No abnormalities on exam. Will refer to Ophthalmology for further evaluation- will try to get in as soon as possible.       Relevant Orders   Ambulatory referral to Ophthalmology    I am having Leslie Morrow maintain her Calcium Carb-Cholecalciferol, meloxicam, mupirocin ointment, clotrimazole-betamethasone, losartan, atorvastatin, and amLODipine.  No orders of the defined types were placed in this encounter.

## 2022-05-26 NOTE — Assessment & Plan Note (Signed)
This is being followed on an annual basis with Dr. Harlow Asa.

## 2022-05-26 NOTE — Assessment & Plan Note (Addendum)
New, x 2 months. No abnormalities on exam. Will refer to Ophthalmology for further evaluation- will try to get in as soon as possible.

## 2022-08-31 ENCOUNTER — Encounter: Payer: Self-pay | Admitting: Family

## 2022-08-31 MED ORDER — LOSARTAN POTASSIUM 25 MG PO TABS: 25.0000 mg | ORAL_TABLET | Freq: Every day | ORAL | 0 refills | Status: DC

## 2022-08-31 MED ORDER — ATORVASTATIN CALCIUM 10 MG PO TABS: 10.0000 mg | ORAL_TABLET | Freq: Every day | ORAL | 0 refills | Status: DC

## 2022-08-31 MED ORDER — AMLODIPINE BESYLATE 5 MG PO TABS: 7.5000 mg | ORAL_TABLET | Freq: Every day | ORAL | 0 refills | Status: DC

## 2022-11-22 ENCOUNTER — Other Ambulatory Visit: Payer: 59

## 2023-01-04 ENCOUNTER — Ambulatory Visit: Payer: Medicaid Other | Admitting: Family

## 2023-01-04 DIAGNOSIS — I10 Essential (primary) hypertension: Secondary | ICD-10-CM | POA: Diagnosis not present

## 2023-01-04 DIAGNOSIS — E785 Hyperlipidemia, unspecified: Secondary | ICD-10-CM | POA: Diagnosis not present

## 2023-01-04 DIAGNOSIS — E041 Nontoxic single thyroid nodule: Secondary | ICD-10-CM

## 2023-01-04 DIAGNOSIS — E119 Type 2 diabetes mellitus without complications: Secondary | ICD-10-CM

## 2023-01-04 MED ORDER — LOSARTAN POTASSIUM 25 MG PO TABS: 25.0000 mg | ORAL_TABLET | Freq: Every day | ORAL | 1 refills | Status: DC

## 2023-01-04 MED ORDER — AMLODIPINE BESYLATE 5 MG PO TABS: 5.0000 mg | ORAL_TABLET | Freq: Every day | ORAL | 1 refills | Status: DC

## 2023-01-04 MED ORDER — ATORVASTATIN CALCIUM 10 MG PO TABS
10.0000 mg | ORAL_TABLET | Freq: Every day | ORAL | 1 refills | Status: DC
Start: 2023-01-04 — End: 2023-09-08

## 2023-01-04 NOTE — Assessment & Plan Note (Signed)
Reports good diet.  Wt Readings from Last 3 Encounters:  01/04/23 234 lb (106.1 kg)  05/26/22 230 lb 12.8 oz (104.7 kg)  03/12/22 231 lb (104.8 kg)   Check A1C. Diet controlled.

## 2023-01-04 NOTE — Assessment & Plan Note (Signed)
Maintained on amlodipine and losartan.   BP Readings from Last 3 Encounters:  01/04/23 128/78  05/26/22 118/70  03/12/22 135/88   BP at goal. Continue same.

## 2023-01-04 NOTE — Assessment & Plan Note (Signed)
Lab Results  Component Value Date   CHOL 145 09/14/2021   HDL 55.70 09/14/2021   LDLCALC 65 09/14/2021   TRIG 119.0 09/14/2021   CHOLHDL 3 09/14/2021   Maintained on lipitor, update lipid panel.

## 2023-01-04 NOTE — Progress Notes (Signed)
Subjective:   By signing my name below, I, Leslie Morrow, attest that this documentation has been prepared under the direction and in the presence of Lemont Fillers, NP 01/06/23   Patient ID: Leslie Morrow, female    DOB: [birth year], 57 y.o.   MRN: [id number]  Chief Complaint  Patient presents with   Hypertension    Here for follow up   Diabetes    Here for follow up    HPI Patient is in today for an office visit.   Hypertension: Her blood pressure was elevated at initial check at 144/79. When rechecked, her blood pressure improved to 128/78. She monitors her blood pressure and reports at home readings are normal. She takes 5 mg Amlodipine daily.   Thyroid nodule: She had a biopsy on 01/2022 of a nodule on her right thyroid. Results of her biopsy were negative.   Diet: She reports having gone to a wedding in [place] last month and eating a lot of sweet food. She otherwise has been maintaining a healthy diet.   Mammogram: She reports her mammogram of left breast is scheduled on 5/21  Cologuard: Last done 04/2020. Results were negative.  Past Medical History:  Diagnosis Date   Diabetes type 2, controlled (HCC)    Hyperlipidemia 02/26/2021   Hypertension    Thyroid nodule 2023   right thyroid FNA, notes Bethesda III, benign nodule.    Past Surgical History:  Procedure Laterality Date   BIOPSY THYROID Right 2023   benign   CHOLECYSTECTOMY  2019   HERNIA REPAIR  2019   ventral hernia    Family History  Problem Relation Age of Onset   Hypertension Mother    Diabetes Father    Diabetes Mellitus II Brother     Social History   Socioeconomic History   Marital status: Married    Spouse name: Event organiser   Number of children: 2   Years of education: Not on file   Highest education level: Not on file  Occupational History   Occupation: unemployed  Tobacco Use   Smoking status: Never   Smokeless tobacco: Never  Substance and Sexual Activity   Alcohol use: Never    Drug use: Never   Sexual activity: Not Currently  Other Topics Concern   Not on file  Social History Narrative   Married   2 children son and daughter- both grown, expecting first grandchild in May   She is from [place], moved to [place] age 34   Housewife   Completed HS equivalent in [place]   Social Determinants of Health   Financial Resource Strain: Not on [employer] Insecurity: Not on file  Transportation Needs: Not on file  Physical Activity: Not on file  Stress: Not on file  Social Connections: Not on file  Intimate Partner Violence: Not on file    Outpatient Medications Prior to Visit  Medication Sig Dispense Refill   Calcium Carb-Cholecalciferol [phone] MG-UNIT CAPS Take by mouth.     amLODipine (NORVASC) 5 MG tablet Take 1.5 tablets (7.5 mg total) by mouth daily. 135 tablet 0   atorvastatin (LIPITOR) 10 MG tablet Take 1 tablet (10 mg total) by mouth daily. 90 tablet 0   losartan (COZAAR) 25 MG tablet Take 1 tablet (25 mg total) by mouth daily. 90 tablet 0   clotrimazole-betamethasone (LOTRISONE) cream Apply 1 application topically 2 (two) times daily. 30 g 0   meloxicam (MOBIC) 7.5 MG tablet Take 1 tablet (7.5 mg total) by mouth  daily as needed for pain. 30 tablet 0   mupirocin ointment (BACTROBAN) 2 % Apply 1 application topically 2 (two) times daily. 22 g 0   No facility-administered medications prior to visit.    Allergies  Allergen Reactions   Naproxen Sodium     Other reaction(s): Other (See Comments) heartburn heartburn heartburn     ROS See HPI    Objective:    Physical Exam Constitutional:      General: She is not in acute distress.    Appearance: Normal appearance. She is well-developed.  HENT:     Head: Normocephalic and atraumatic.     Right Ear: External ear normal.     Left Ear: External ear normal.  Eyes:     General: No scleral icterus. Neck:     Thyroid: No thyromegaly.     Comments: Thyroid fullness  Cardiovascular:     Rate  and Rhythm: Normal rate and regular rhythm.     Heart sounds: Normal heart sounds. No murmur heard. Pulmonary:     Effort: Pulmonary effort is normal. No respiratory distress.     Breath sounds: Normal breath sounds. No wheezing.  Musculoskeletal:     Cervical back: Neck supple.  Skin:    General: Skin is warm and dry.  Neurological:     Mental Status: She is alert and oriented to person, place, and time.  Psychiatric:        Mood and Affect: Mood normal.        Behavior: Behavior normal.        Thought Content: Thought content normal.        Judgment: Judgment normal.    Diabetic Foot Exam - Simple   Simple Foot Form Diabetic Foot exam was performed with the following findings: Yes 01/04/2023  3:07 PM  Visual Inspection No deformities, no ulcerations, no other skin breakdown bilaterally: Yes Sensation Testing Intact to touch and monofilament testing bilaterally: Yes Pulse Check Posterior Tibialis and Dorsalis pulse intact bilaterally: Yes Comments      BP 128/78 (BP Location: Left Arm, Patient Position: Sitting)   Pulse 65   Temp 98 F (36.7 C) (Oral)   Resp 16   Wt 234 lb (106.1 kg)   SpO2 100%   BMI 35.58 kg/m  Wt Readings from Last 3 Encounters:  01/04/23 234 lb (106.1 kg)  05/26/22 230 lb 12.8 oz (104.7 kg)  03/12/22 231 lb (104.8 kg)       Assessment & Plan:  Controlled type 2 diabetes mellitus without complication, without long-term current use of insulin (HCC) Assessment & Plan: Reports good diet.  Wt Readings from Last 3 Encounters:  01/04/23 234 lb (106.1 kg)  05/26/22 230 lb 12.8 oz (104.7 kg)  03/12/22 231 lb (104.8 kg)   Check A1C. Diet controlled.   Orders: -     Hemoglobin A1c -     Microalbumin / creatinine urine ratio  Primary hypertension Assessment & Plan: Maintained on amlodipine and losartan.   BP Readings from Last 3 Encounters:  01/04/23 128/78  05/26/22 118/70  03/12/22 135/88   BP at goal. Continue same.   Orders: -      Comprehensive metabolic panel  Hyperlipidemia, unspecified hyperlipidemia type Assessment & Plan: Lab Results  Component Value Date   CHOL 145 09/14/2021   HDL 55.70 09/14/2021   LDLCALC 65 09/14/2021   TRIG 119.0 09/14/2021   CHOLHDL 3 09/14/2021   Maintained on lipitor, update lipid panel.   Orders: -  Lipid panel -     Atorvastatin Calcium; Take 1 tablet (10 mg total) by mouth daily.  Dispense: 90 tablet; Refill: 1  Thyroid nodule Assessment & Plan: Repeat US thyroid.  ? If fullness is more prominent.   Hx of Bethesda III nodule with negative Afirma 2023.   Orders: -     US THYROID; Future -     TSH  Other orders -     Losartan Potassium; Take 1 tablet (25 mg total) by mouth daily.  Dispense: 90 tablet; Refill: 1 -     amLODIPine Besylate; Take 1 tablet (5 mg total) by mouth daily.  Dispense: 90 tablet; Refill: 1     I,Rachel Rivera,acting as a scribe for Lemont Fillers, NP.,have documented all relevant documentation on the behalf of Lemont Fillers, NP,as directed by  Lemont Fillers, NP while in the presence of Lemont Fillers, NP.   I, Lemont Fillers, NP, personally preformed the services described in this documentation.  All medical record entries made by the scribe were at my direction and in my presence.  I have reviewed the chart and discharge instructions (if applicable) and agree that the record reflects my personal performance and is accurate and complete. 01/06/23   Lemont Fillers, NP

## 2023-01-05 LAB — LIPID PANEL
Cholesterol: 177 mg/dL (ref 0–200)
HDL: 55.8 mg/dL (ref >39.00)
LDL Cholesterol: 89 mg/dL (ref 0–99)
NonHDL: 120.76
Total CHOL/HDL Ratio: 3
Triglycerides: 160 mg/dL — ABNORMAL HIGH (ref 0.0–149.0)
VLDL: 32 mg/dL (ref 0.0–40.0)

## 2023-01-05 LAB — COMPREHENSIVE METABOLIC PANEL
ALT: 16 U/L (ref 0–35)
AST: 17 U/L (ref 0–37)
Albumin: 4.2 g/dL (ref 3.5–5.2)
Alkaline Phosphatase: 65 U/L (ref 39–117)
BUN: 13 mg/dL (ref 6–23)
CO2: 28 mEq/L (ref 19–32)
Calcium: 9.4 mg/dL (ref 8.4–10.5)
Chloride: 105 mEq/L (ref 96–112)
Creatinine, Ser: 0.86 mg/dL (ref 0.40–1.20)
GFR: 75.68 mL/min (ref >60.00)
Glucose, Bld: 105 mg/dL — ABNORMAL HIGH (ref 70–99)
Potassium: 3.8 mEq/L (ref 3.5–5.1)
Sodium: 140 mEq/L (ref 135–145)
Total Bilirubin: 0.5 mg/dL (ref 0.2–1.2)
Total Protein: 7.1 g/dL (ref 6.0–8.3)

## 2023-01-05 LAB — HEMOGLOBIN A1C: Hgb A1c MFr Bld: 6.5 % (ref 4.6–6.5)

## 2023-01-05 LAB — TSH: TSH: 1.17 u[IU]/mL (ref 0.35–5.50)

## 2023-01-05 LAB — MICROALBUMIN / CREATININE URINE RATIO
Creatinine,U: 27.2 mg/dL
Microalb Creat Ratio: 2.6 mg/g (ref 0.0–30.0)
Microalb, Ur: <0.7 mg/dL (ref 0.0–1.9)

## 2023-01-06 NOTE — Assessment & Plan Note (Signed)
Repeat US thyroid.  ? If fullness is more prominent.   Hx of Bethesda III nodule with negative Afirma 2023.

## 2023-01-08 ENCOUNTER — Ambulatory Visit (HOSPITAL_BASED_OUTPATIENT_CLINIC_OR_DEPARTMENT_OTHER)
Admission: RE | Admit: 2023-01-08 | Discharge: 2023-01-08 | Disposition: A | Discharge status: Home / Self Care | Payer: Medicaid Other | Source: Ambulatory Visit | Attending: Family | Admitting: Family

## 2023-01-08 ENCOUNTER — Encounter: Payer: Self-pay | Admitting: Family

## 2023-01-08 DIAGNOSIS — E041 Nontoxic single thyroid nodule: Secondary | ICD-10-CM | POA: Insufficient documentation

## 2023-01-08 DIAGNOSIS — R590 Localized enlarged lymph nodes: Secondary | ICD-10-CM | POA: Diagnosis not present

## 2023-01-10 ENCOUNTER — Encounter: Payer: Self-pay | Admitting: Family

## 2023-01-10 ENCOUNTER — Telehealth: Payer: Self-pay | Admitting: Family

## 2023-01-10 DIAGNOSIS — R591 Generalized enlarged lymph nodes: Secondary | ICD-10-CM

## 2023-01-10 DIAGNOSIS — E041 Nontoxic single thyroid nodule: Secondary | ICD-10-CM

## 2023-01-10 NOTE — Telephone Encounter (Signed)
Patient notified of results and is scheduled for ct on 01/20/23

## 2023-01-10 NOTE — Telephone Encounter (Signed)
Please advise pt th her ultrasound shows that her thyroid nodule is stable/unchanged.  There is a lymph node that the radiologist would like for [place] to take a closer look at with CT.  Order placed.

## 2023-01-11 ENCOUNTER — Ambulatory Visit
Admission: RE | Admit: 2023-01-11 | Discharge: 2023-01-11 | Disposition: A | Discharge status: Home / Self Care | Payer: Medicaid Other | Source: Ambulatory Visit | Attending: Family | Admitting: Family

## 2023-01-11 ENCOUNTER — Ambulatory Visit
Admission: RE | Admit: 2023-01-11 | Discharge: 2023-01-11 | Disposition: A | Discharge status: Home / Self Care | Payer: 59 | Source: Ambulatory Visit | Attending: Family | Admitting: Family

## 2023-01-11 DIAGNOSIS — N6320 Unspecified lump in the left breast, unspecified quadrant: Secondary | ICD-10-CM

## 2023-01-11 DIAGNOSIS — N6002 Solitary cyst of left breast: Secondary | ICD-10-CM | POA: Diagnosis not present

## 2023-01-11 DIAGNOSIS — R92323 Mammographic fibroglandular density, bilateral breasts: Secondary | ICD-10-CM | POA: Diagnosis not present

## 2023-01-11 DIAGNOSIS — N63 Unspecified lump in unspecified breast: Secondary | ICD-10-CM | POA: Diagnosis not present

## 2023-01-11 DIAGNOSIS — R92322 Mammographic fibroglandular density, left breast: Secondary | ICD-10-CM | POA: Diagnosis not present

## 2023-01-13 ENCOUNTER — Other Ambulatory Visit: Payer: Self-pay | Admitting: Family

## 2023-01-13 DIAGNOSIS — N6320 Unspecified lump in the left breast, unspecified quadrant: Secondary | ICD-10-CM

## 2023-01-19 ENCOUNTER — Encounter: Payer: Self-pay | Admitting: Surgery

## 2023-01-20 ENCOUNTER — Ambulatory Visit (HOSPITAL_BASED_OUTPATIENT_CLINIC_OR_DEPARTMENT_OTHER): Payer: Medicaid Other

## 2023-01-22 ENCOUNTER — Telehealth (HOSPITAL_BASED_OUTPATIENT_CLINIC_OR_DEPARTMENT_OTHER): Payer: Self-pay

## 2023-01-24 ENCOUNTER — Telehealth: Payer: Self-pay | Admitting: Family

## 2023-01-24 ENCOUNTER — Encounter: Payer: Self-pay | Admitting: Family

## 2023-01-24 NOTE — Telephone Encounter (Addendum)
Could you please confirm patient has an appointment scheduled with the surgeon (Dr. Gerrit Friends) for her thyroid?  If not, please give her number to [place] surgery to call.

## 2023-01-24 NOTE — Telephone Encounter (Signed)
Patient had appointment for 6/6 but she reports she cancelled it because she though she was getting a biopsy first. I advised patient to call [facility] and reschedule her appointment. She has the phone number.

## 2023-02-07 NOTE — Progress Notes (Signed)
Suttle, Thressa Sheller, MD  Kristin Bruins, MD; Leodis Rains D PROCEDURE / BIOPSY REVIEW Date: 02/07/23  Requested Biopsy site: Right supraclavicular lymph node Reason for request: per Surgeon request, internal calcifications Imaging review: Best seen on thyroid US from 01/08/23, image 90  Decision: Approved Imaging modality to perform: Ultrasound Schedule with: No sedation / Local anesthetic Schedule for: Any VIR  Additional comments: #FTM  Please contact me with questions, concerns, or if issue pertaining to this request arise.  Bennie Dallas, MD [facility]

## 2023-02-07 NOTE — Progress Notes (Signed)
Simonne Come, MD  Leodis Rains D OK for US guided right supraclavicular LN Bx.  Recommend performing this biopsy at the hospital as we will likely core the LN.  Sedation per pt request.  Nedra Hai

## 2023-02-08 ENCOUNTER — Other Ambulatory Visit (HOSPITAL_COMMUNITY): Payer: Self-pay | Admitting: Surgery

## 2023-02-08 ENCOUNTER — Encounter: Payer: Self-pay | Admitting: Family

## 2023-02-08 DIAGNOSIS — E042 Nontoxic multinodular goiter: Secondary | ICD-10-CM

## 2023-02-08 MED ORDER — MELOXICAM 7.5 MG PO TABS: 7.5000 mg | ORAL_TABLET | Freq: Every day | ORAL | 0 refills | Status: DC

## 2023-02-16 ENCOUNTER — Other Ambulatory Visit: Payer: Self-pay | Admitting: Radiology

## 2023-02-17 ENCOUNTER — Other Ambulatory Visit: Payer: Self-pay | Admitting: Internal Medicine

## 2023-02-18 ENCOUNTER — Other Ambulatory Visit: Payer: Self-pay

## 2023-02-18 ENCOUNTER — Ambulatory Visit (HOSPITAL_COMMUNITY)
Admission: RE | Admit: 2023-02-18 | Discharge: 2023-02-18 | Disposition: A | Discharge status: Home / Self Care | Payer: Medicaid Other | Source: Ambulatory Visit | Attending: Surgery | Admitting: Surgery

## 2023-02-18 DIAGNOSIS — Z85850 Personal history of malignant neoplasm of thyroid: Secondary | ICD-10-CM | POA: Diagnosis not present

## 2023-02-18 DIAGNOSIS — E042 Nontoxic multinodular goiter: Secondary | ICD-10-CM | POA: Diagnosis not present

## 2023-02-18 DIAGNOSIS — I888 Other nonspecific lymphadenitis: Secondary | ICD-10-CM | POA: Diagnosis not present

## 2023-02-18 DIAGNOSIS — R599 Enlarged lymph nodes, unspecified: Secondary | ICD-10-CM | POA: Diagnosis not present

## 2023-02-18 MED ORDER — LIDOCAINE HCL (PF) 1 % IJ SOLN
10.0000 mL | Freq: Once | INTRAMUSCULAR | Status: AC
  Administered 2023-02-18: 10 mL via INTRADERMAL

## 2023-02-18 MED ORDER — SODIUM CHLORIDE 0.9 % IV SOLN: INTRAVENOUS | Status: DC

## 2023-02-18 NOTE — Procedures (Signed)
Interventional Radiology Procedure Note  Procedure: US guided biopsy of right cervical lymph node. Mx 18g core and 20g core Complications: None EBL: None Recommendations:   - Routine wound care - Follow up pathology   Signed,  Gilmer Mor, DO

## 2023-02-21 LAB — SURGICAL PATHOLOGY

## 2023-02-28 NOTE — Progress Notes (Signed)
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 Special stains will be reported as an addendum."  This is the reading from pathology.  A full report will follow.  No sign of cancer.  Patient may need referral to rheumatology - will leave up to her primary care provider.  As for her thyroid nodules, she is due for a repeat thyroid USN and a TSH level.  I will ask my nurse, Tresa Endo, to arrange these studies and then I will see her in the office to review the results and perform a physical exam.  Tresa Endo - please arrange.  tmg  Darnell Level, MD [facility] practice Office: [phone]

## 2023-03-02 ENCOUNTER — Telehealth: Payer: Self-pay | Admitting: Family

## 2023-03-02 DIAGNOSIS — D869 Sarcoidosis, unspecified: Secondary | ICD-10-CM | POA: Insufficient documentation

## 2023-03-02 NOTE — Telephone Encounter (Signed)
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  I would like for her to meet with rheumatology for further evaluation.

## 2023-03-02 NOTE — Telephone Encounter (Signed)
Patient notified of results and referral 

## 2023-05-09 ENCOUNTER — Encounter: Payer: Self-pay | Admitting: Family

## 2023-05-09 DIAGNOSIS — Z1211 Encounter for screening for malignant neoplasm of colon: Secondary | ICD-10-CM

## 2023-05-16 DIAGNOSIS — Z1211 Encounter for screening for malignant neoplasm of colon: Secondary | ICD-10-CM | POA: Diagnosis not present

## 2023-05-21 LAB — COLOGUARD: COLOGUARD: NEGATIVE

## 2023-07-02 IMAGING — CT CT MAXILLOFACIAL W/O CM
3 series · 15 of 47 positions shown, 18 images · non-contrast
Comparison: None.

CLINICAL DATA: Trip and fall, facial trauma

EXAM:
CT HEAD WITHOUT CONTRAST
CT MAXILLOFACIAL WITHOUT CONTRAST
CT CERVICAL SPINE WITHOUT CONTRAST
TECHNIQUE: Multidetector CT imaging of the head, cervical spine, and
maxillofacial structures were performed using the standard protocol
without intravenous contrast. Multiplanar CT image reconstructions
of the cervical spine and maxillofacial structures were also
generated.

[Series 4: 1 max soft · axial · 0.35mm/px · z∈[+984,+1134]mm · 9 of 89 slices shown, 12 images]
[im 7/89  brain]
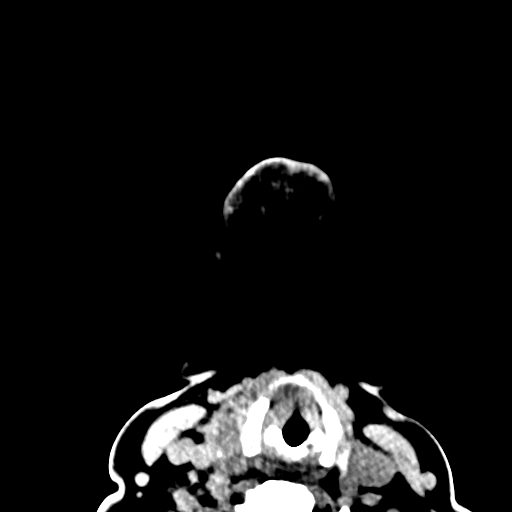
[im 7/89  bone]
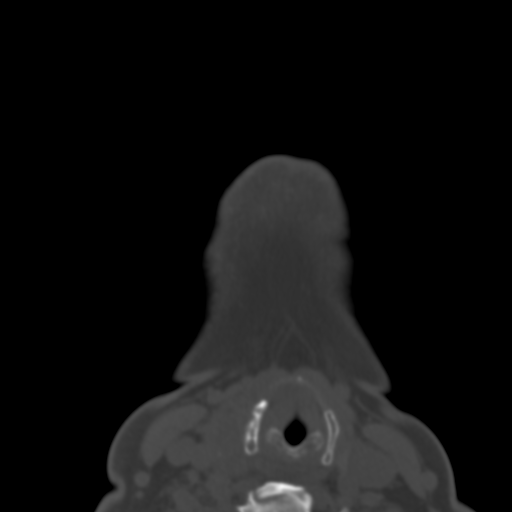
[im 16/89  bone]
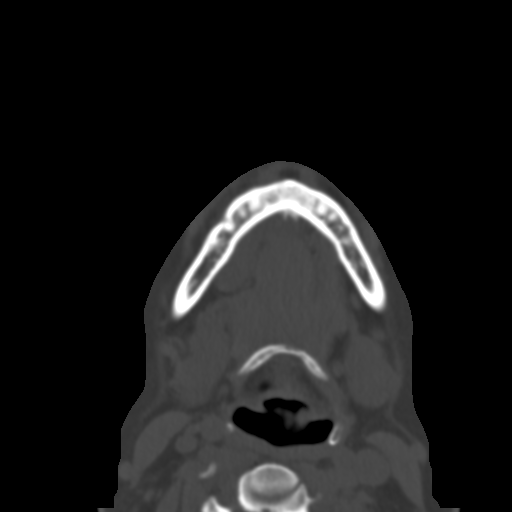
[im 25/89  bone]
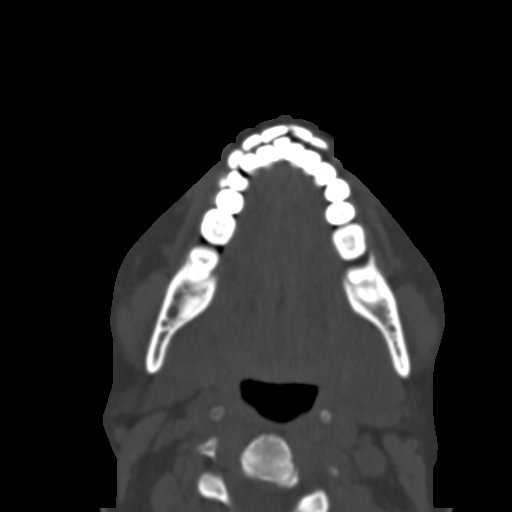
[im 34/89  bone]
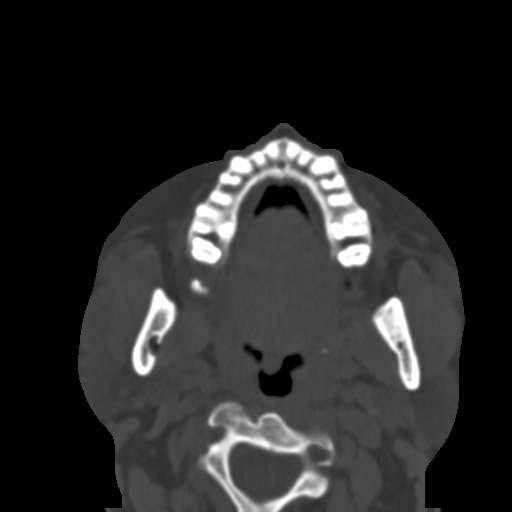
[im 46/89  brain]
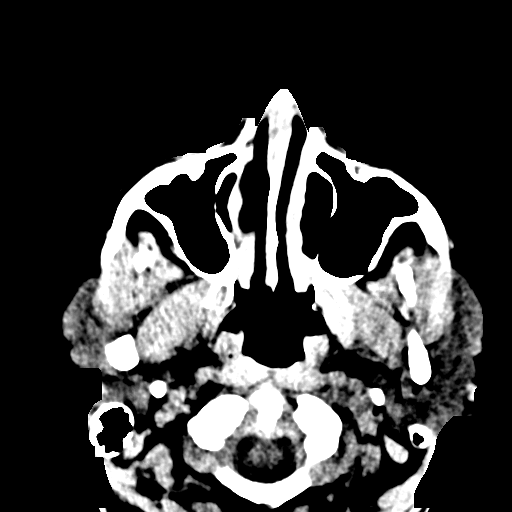
[im 46/89  bone]
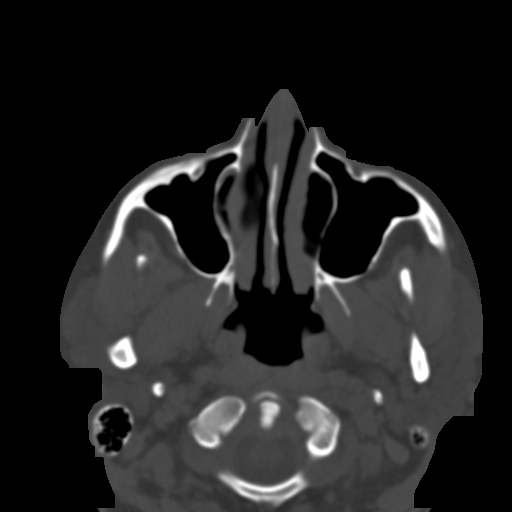
[im 55/89  bone]
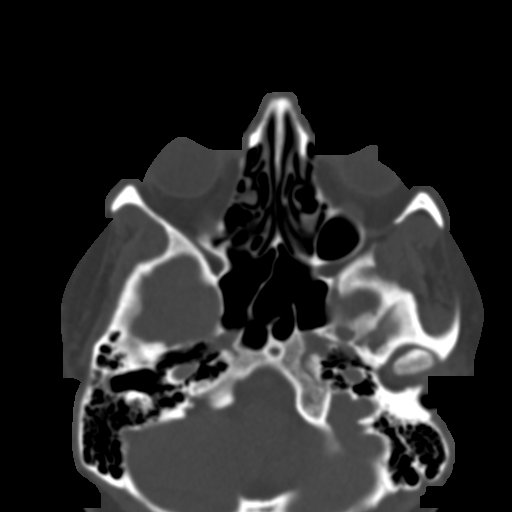
[im 64/89  bone]
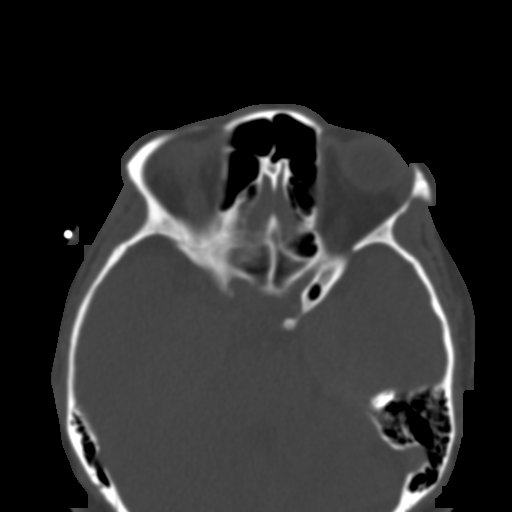
[im 73/89  bone]
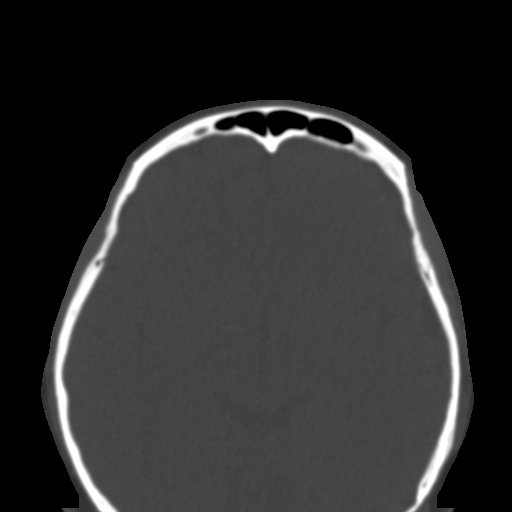
[im 82/89  brain]
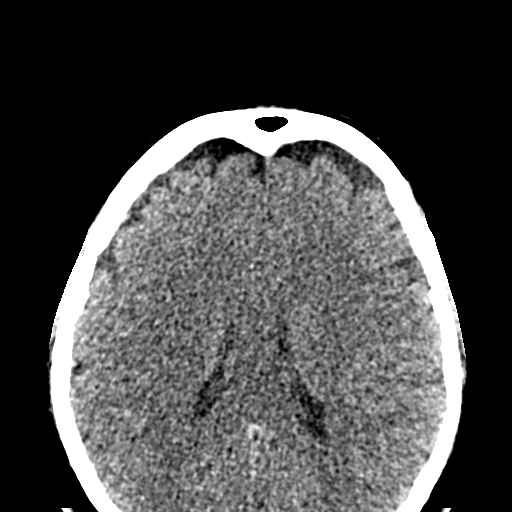
[im 82/89  bone]
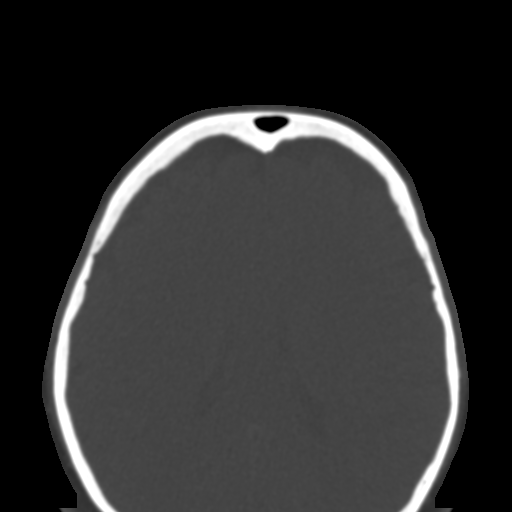

[Series 6: coronal soft · coronal · 0.34mm/px · 3 of 80 slices shown]
[im 27/80  bone]
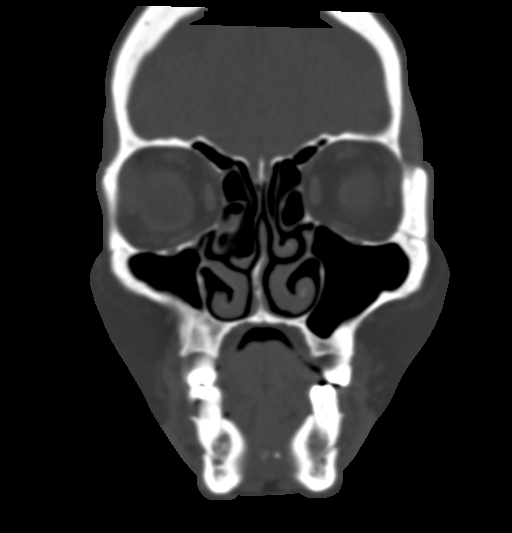
[im 36/80  bone]
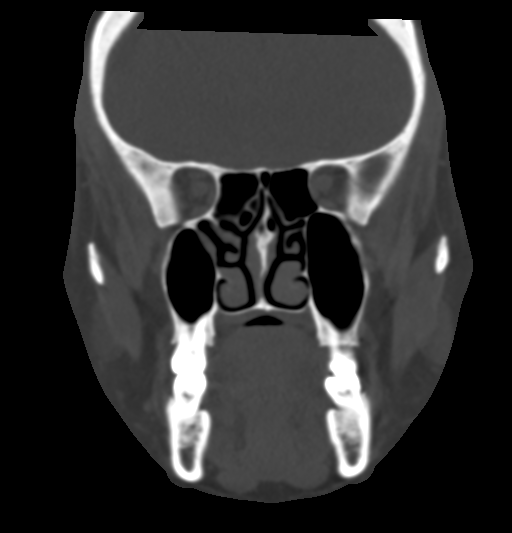
[im 44/80  bone]
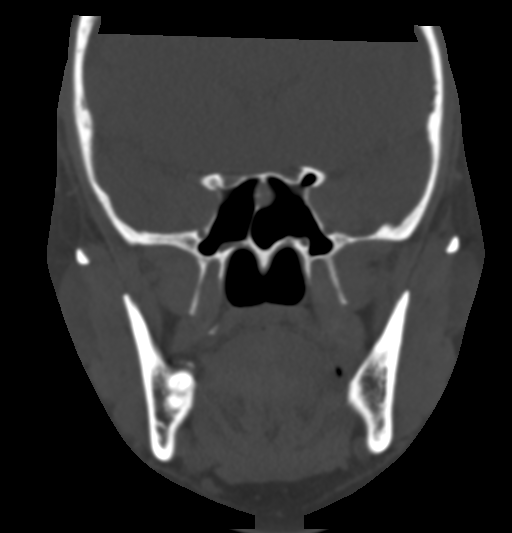

[Series 7: sagittal soft · sagittal · 0.31mm/px · 3 of 87 slices shown]
[im 29/87  bone]
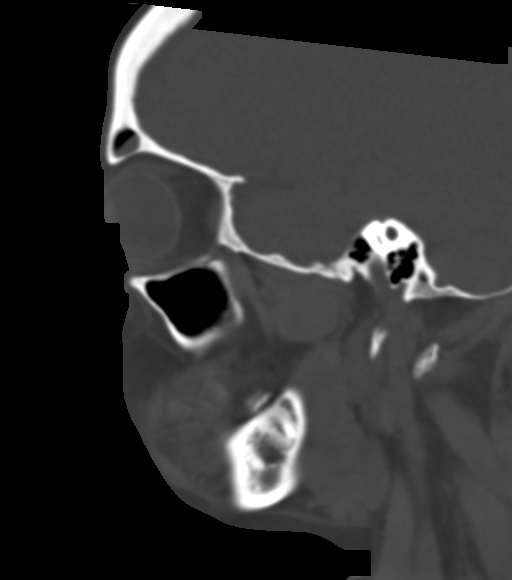
[im 44/87  bone]
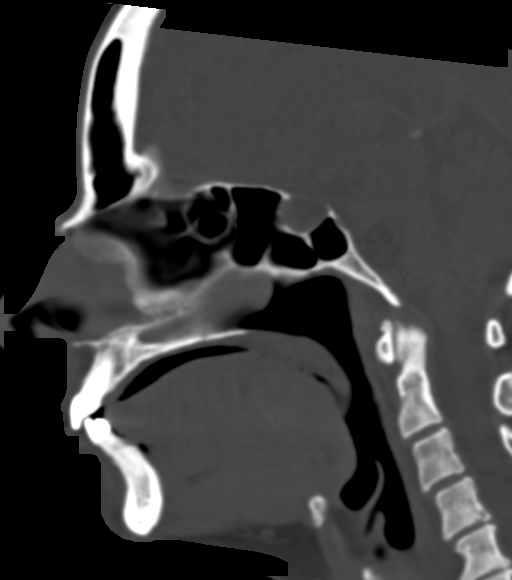
[im 58/87  bone]
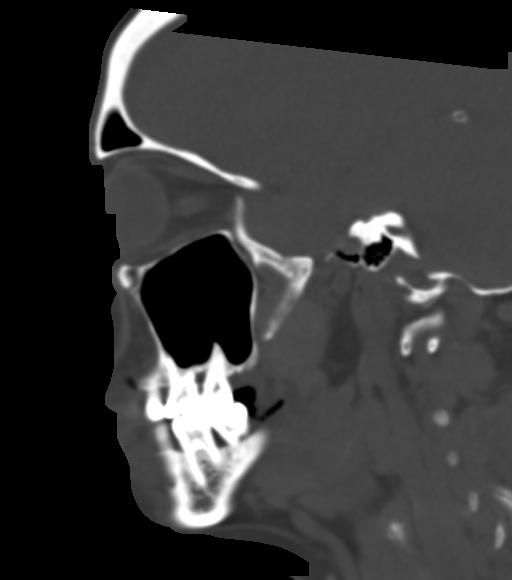

[15 of 47 positions shown; findings below may reference images not displayed]

FINDINGS: CT HEAD FINDINGS

Brain: No evidence of acute infarction, hemorrhage, hydrocephalus,
extra-axial collection or mass lesion/mass effect.

Vascular: No hyperdense vessel or unexpected calcification.

CT FACIAL BONES FINDINGS

Skull: Normal. Negative for fracture or focal lesion.

Facial bones: No displaced fractures or dislocations.

Sinuses/Orbits: No acute finding.

Other: None.

CT CERVICAL SPINE FINDINGS

Alignment: Normal.

Skull base and vertebrae: No acute fracture. No primary bone lesion
or focal pathologic process.

Soft tissues and spinal canal: No prevertebral fluid or swelling. No
visible canal hematoma.

Disc levels: Moderate disc space height loss and osteophytosis of
the lower cervical levels.

Upper chest: Negative.

Other: Low-attenuation nodule of the right lobe of the thyroid with
an eccentric calcification measuring 3.0 cm.
IMPRESSION: 1. No acute intracranial pathology.
2. No displaced fractures or dislocations of the facial bones.
3. No fracture or static subluxation of the cervical spine.
4. Low-attenuation nodule of the right lobe of the thyroid with an
eccentric calcification measuring 3.0 cm. This is likely a colloid
cyst given appearance however recommend dedicated thyroid ultrasound
on a nonemergent outpatient basis to further evaluate (ref: [HOSPITAL]. [DATE]): 143-50).

## 2023-07-02 IMAGING — CT CT CERVICAL SPINE W/O CM
3 of 4 series · 10 of 35 positions shown, 11 images · non-contrast
Comparison: None.

CLINICAL DATA: Trip and fall, facial trauma

EXAM:
CT HEAD WITHOUT CONTRAST
CT MAXILLOFACIAL WITHOUT CONTRAST
CT CERVICAL SPINE WITHOUT CONTRAST
TECHNIQUE: Multidetector CT imaging of the head, cervical spine, and
maxillofacial structures were performed using the standard protocol
without intravenous contrast. Multiplanar CT image reconstructions
of the cervical spine and maxillofacial structures were also
generated.

[Series 5: cor bone · coronal · 0.29mm/px · 3 of 51 slices shown]
[im 11/51  bone]
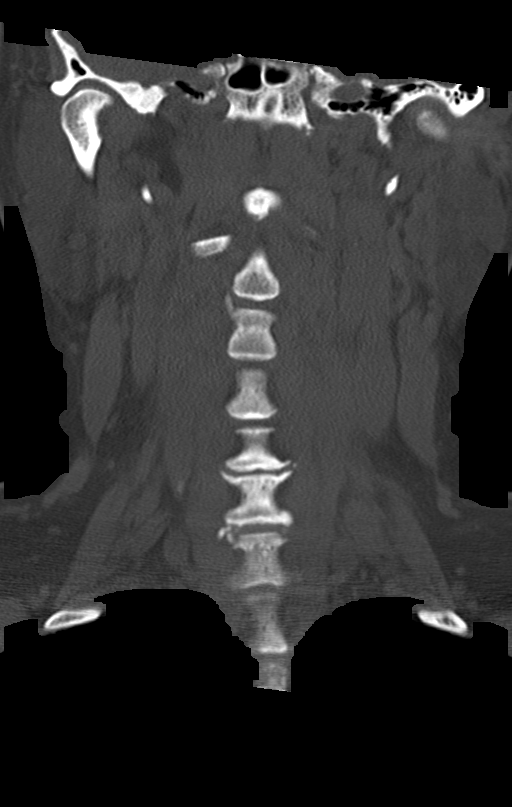
[im 21/51  bone]
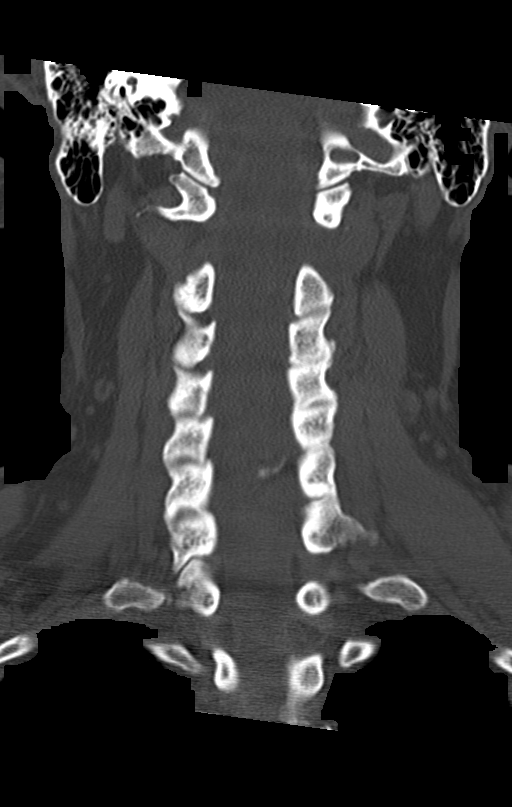
[im 31/51  bone]
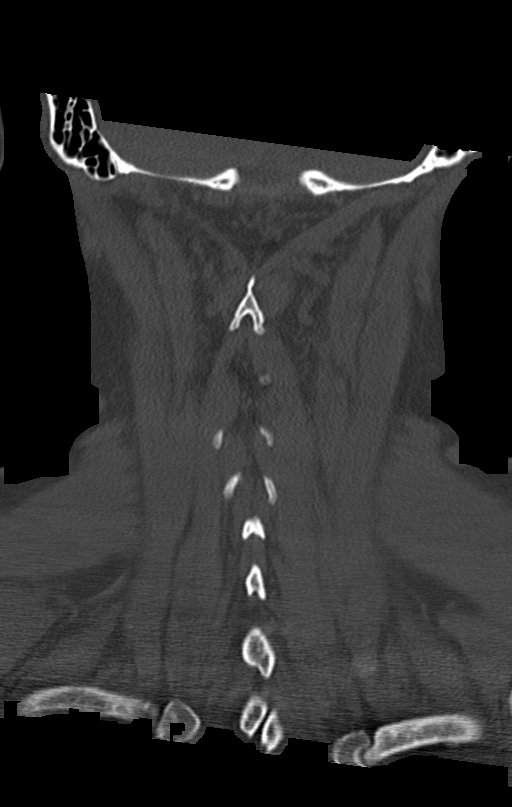

[Series 6: sag bone · sagittal · 0.20mm/px · 5 of 74 slices shown]
[im 25/74  bone]
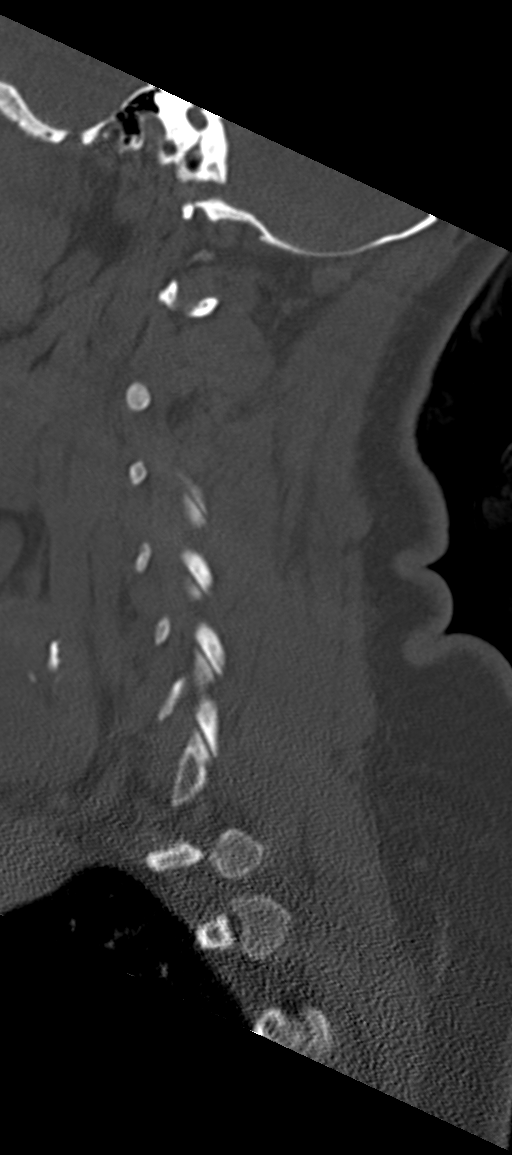
[im 31/74  bone]
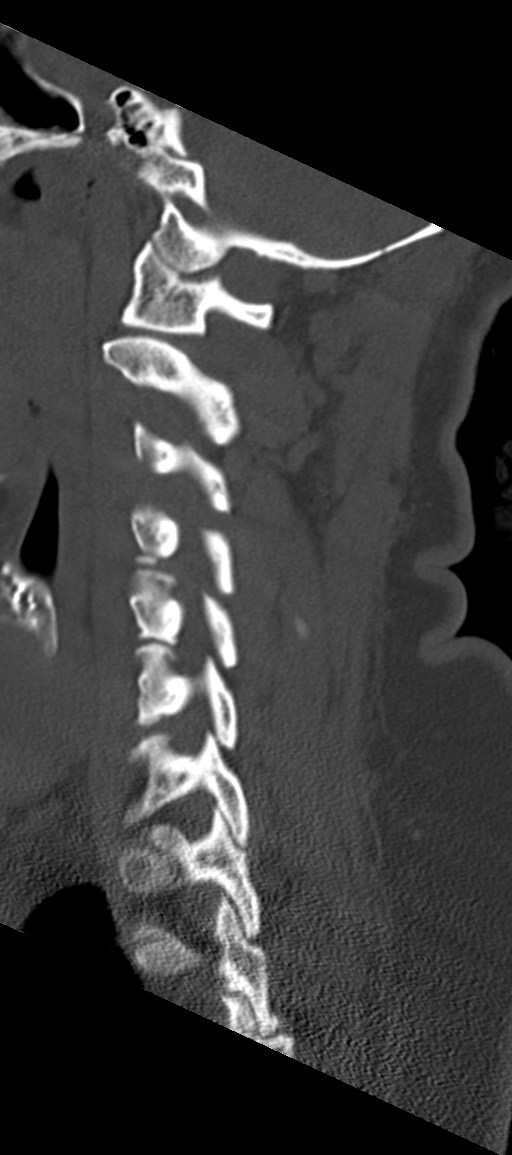
[im 37/74  bone]
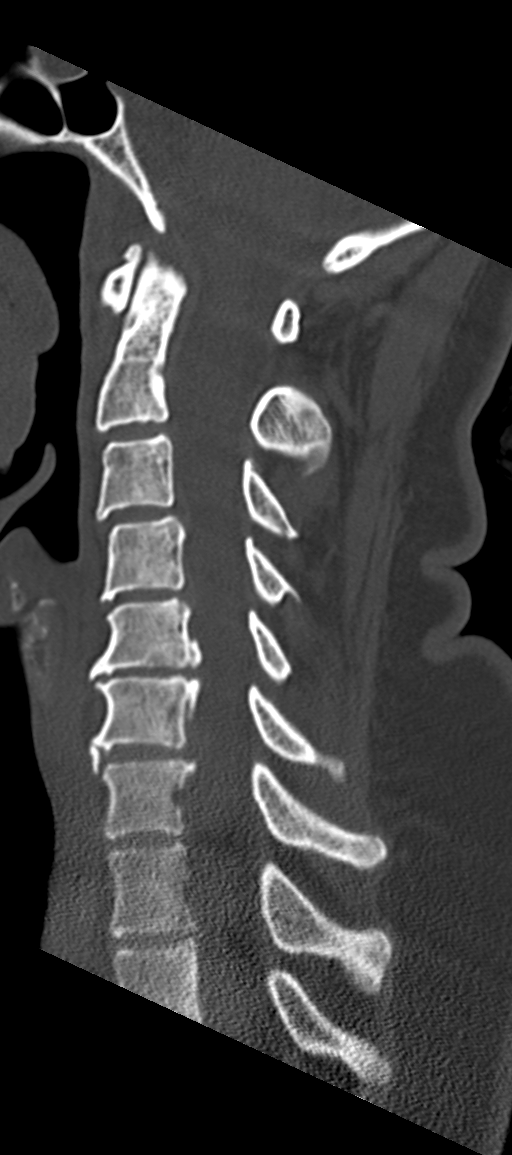
[im 43/74  bone]
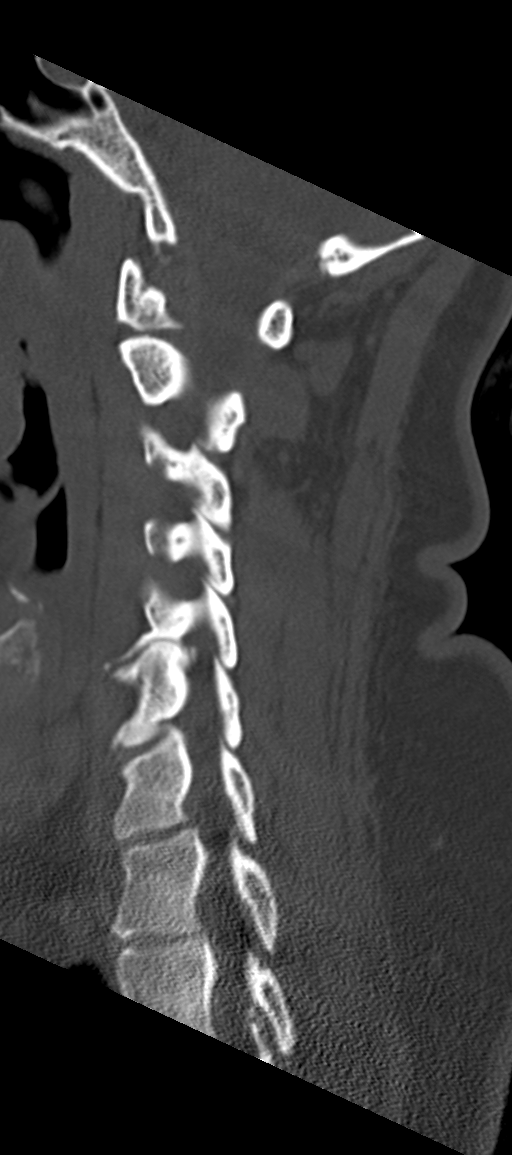
[im 49/74  bone]
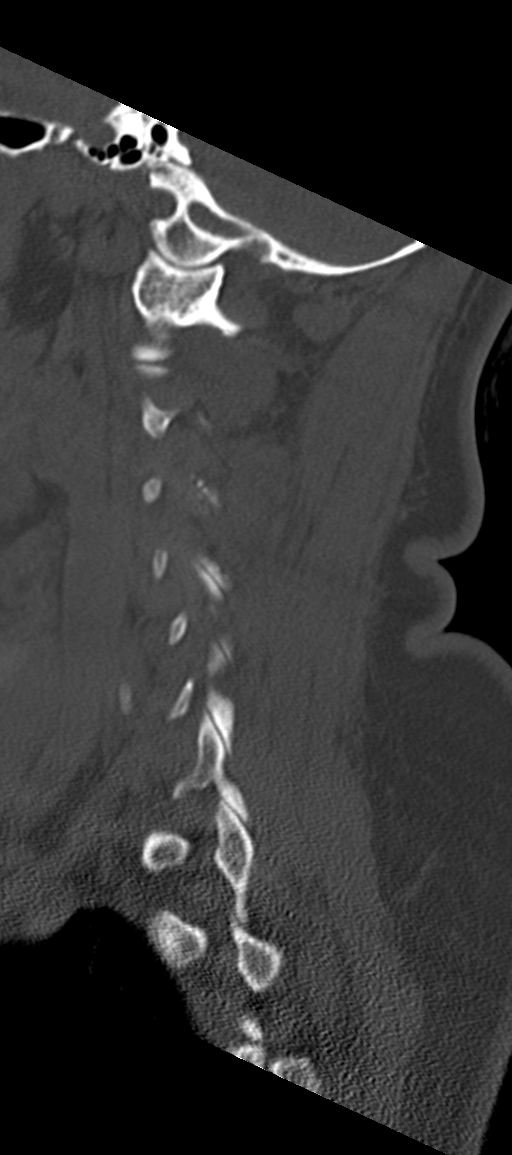

[Series 7: orthogonal axials · axial · 0.21mm/px · z∈[+957,+1013]mm · 2 of 85 slices shown, 3 images]
[im 29/85  soft-tissue]
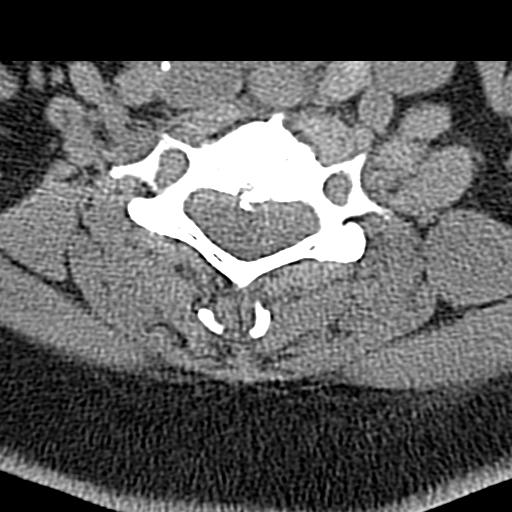
[im 29/85  bone]
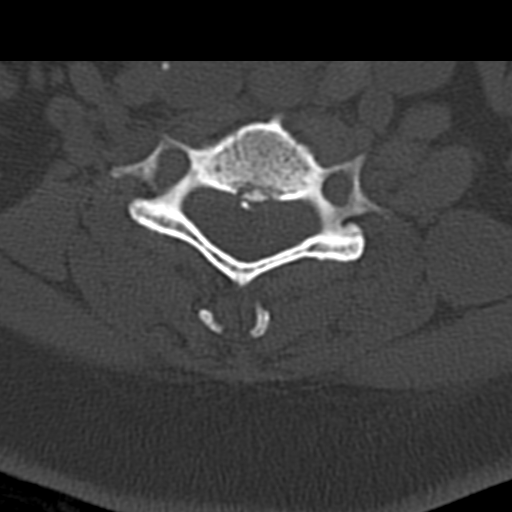
[im 57/85  bone]
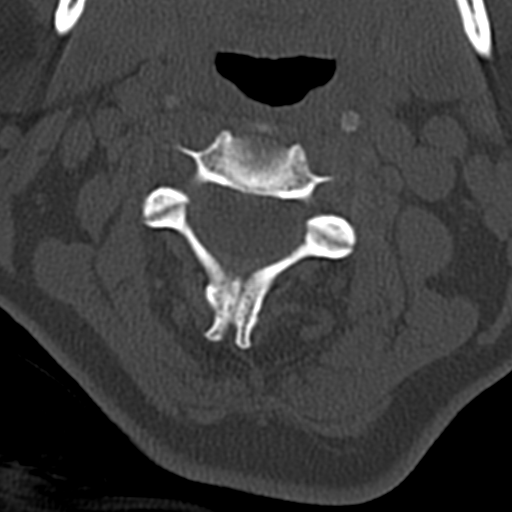

[10 of 35 positions shown; findings below may reference images not displayed]

FINDINGS: CT HEAD FINDINGS

Brain: No evidence of acute infarction, hemorrhage, hydrocephalus,
extra-axial collection or mass lesion/mass effect.

Vascular: No hyperdense vessel or unexpected calcification.

CT FACIAL BONES FINDINGS

Skull: Normal. Negative for fracture or focal lesion.

Facial bones: No displaced fractures or dislocations.

Sinuses/Orbits: No acute finding.

Other: None.

CT CERVICAL SPINE FINDINGS

Alignment: Normal.

Skull base and vertebrae: No acute fracture. No primary bone lesion
or focal pathologic process.

Soft tissues and spinal canal: No prevertebral fluid or swelling. No
visible canal hematoma.

Disc levels: Moderate disc space height loss and osteophytosis of
the lower cervical levels.

Upper chest: Negative.

Other: Low-attenuation nodule of the right lobe of the thyroid with
an eccentric calcification measuring 3.0 cm.
IMPRESSION: 1. No acute intracranial pathology.
2. No displaced fractures or dislocations of the facial bones.
3. No fracture or static subluxation of the cervical spine.
4. Low-attenuation nodule of the right lobe of the thyroid with an
eccentric calcification measuring 3.0 cm. This is likely a colloid
cyst given appearance however recommend dedicated thyroid ultrasound
on a nonemergent outpatient basis to further evaluate (ref: [HOSPITAL]. [DATE]): 143-50).

## 2023-07-15 ENCOUNTER — Other Ambulatory Visit: Payer: Medicaid Other

## 2023-07-27 NOTE — Progress Notes (Unsigned)
Office Visit Note  Patient: Leslie Morrow             Date of Birth: 03/31/1966           MRN: [id number]             PCP: Sandford Craze, NP Referring: Sandford Craze, NP Visit Date: 07/28/2023 Occupation: Childcare  Subjective:  New Patient (Initial Visit) (Patient states she sometimes has pain in her lower back and right shoulder. )    Discussed the use of AI scribe software for clinical note transcription with the patient, who gave verbal consent to proceed.  History of Present Illness   Leslie Morrow is a 57 y.o. female here for evaluation of cervical lymphadenopathy with biopsy obtained due to calcified nodule and concerning for sarcoidosis due to non necrotizing granulomas. She reported no discomfort or swelling associated with the nodule. The nodule was discovered during a routine check-up and was subsequently biopsied. The patient denied any history of lung problems, infections, or skin rashes.  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 She also reported swelling in the left leg, particularly during trips to [place], but denied any other joint pain or stiffness.  The patient has varicose veins and some edema in the leg, likely related to the veins.  She underwent TTE in 2022 with normal ejection fraction.   02/18/23 Lymph node biopsy A. LYMPH NODE, RIGHT CERVICAL, NEEDLE CORE BIOPSY: Non-necrotizing granulomas. Negative for metastatic carcinoma. Stains for infectious etiology negative  12/24/20 TTE IMPRESSIONS   1. Basal portion of the intraventricular septum creating turbuemt flow within LVOT. Lack of typical futures of HOCM. Peak gradient across LVOT 13 mmHg at rest and with Valsalva - 32 mmHg - only mild. Left ventricular ejection fraction, by estimation, is  60 to 65%. The left ventricle has normal function. The left ventricle has no regional wall motion abnormalities. Left  ventricular diastolic parameters were normal.   2. Right ventricular systolic function is normal. The right ventricular size is normal.   3. The mitral valve is normal in structure. No evidence of mitral valve regurgitation. No evidence of mitral stenosis.   4. The aortic valve is normal in structure. Aortic valve regurgitation is not visualized. No aortic stenosis is present.   5. The inferior vena cava is normal in size with greater than 50% respiratory variability, suggesting right atrial pressure of 3 mmHg.   Activities of Daily Living:  Patient reports morning stiffness for less than 5 minutes.   Patient Reports nocturnal pain.  Difficulty dressing/grooming: Denies Difficulty climbing stairs: Denies Difficulty getting out of chair: Denies Difficulty using hands for taps, buttons, cutlery, and/or writing: Denies  Review of Systems  Constitutional:  Negative for fatigue.  HENT:  Negative for mouth sores and mouth dryness.   Eyes:  Positive for dryness.  Respiratory:  Negative for shortness of breath.   Cardiovascular:  Negative for chest pain and palpitations.  Gastrointestinal:  Negative for blood in stool, constipation and diarrhea.  Endocrine: Negative for increased urination.  Genitourinary:  Positive for involuntary urination.  Musculoskeletal:  Positive for joint pain, joint pain, myalgias, morning stiffness and myalgias. Negative for gait problem, joint swelling, muscle weakness and muscle tenderness.  Skin:  Negative for color change, rash, hair loss and sensitivity to sunlight.  Allergic/Immunologic: Negative for susceptible to infections.  Neurological:  Negative for dizziness and headaches.  Hematological:  Negative for swollen glands.  Psychiatric/Behavioral:  Positive for sleep disturbance. Negative for depressed mood. The patient is not nervous/anxious.     PMFS History:  Patient Active Problem List   Diagnosis Date Noted   Vitamin D deficiency 07/28/2023    Abnormal laboratory test result 07/28/2023   Lymphadenopathy 01/10/2023   Discomfort of right eye 05/26/2022   Thyroid nodule 09/14/2021   Skin rash 09/14/2021   Diabetes type 2, controlled (HCC) 02/26/2021   Hyperlipidemia 02/26/2021   Hypertension 10/23/2019   Morbid obesity (HCC) 10/23/2019    Past Medical History:  Diagnosis Date   Diabetes type 2, controlled (HCC)    Hyperlipidemia 02/26/2021   Hypertension    Thyroid nodule 2023   right thyroid FNA, notes [facility]hes[facility] III, benign nodule.    Family History  Problem Relation Age of Onset   Hypertension Mother    Diabetes Father    Diabetes Mellitus II Brother    Past Surgical History:  Procedure Laterality Date   BIOPSY THYROID Right 2023   benign   CHOLECYSTECTOMY  2019   HERNIA REPAIR  2019   ventral hernia   Social History   Social History Narrative   Married   2 children son and daughter- both grown, expecting first grandchild in May   She is from [place], moved to [place] age 66   Housewife   Completed HS equivalent in [place]   Immunization History  Administered Date(s) Administered   Influenza,inj,Quad PF,6+ Mos 04/30/2020   Moderna SARS-COV2 Booster Vaccination 07/20/2020   Moderna Sars-Covid-2 Vaccination 12/25/2019, 01/22/2020   Pneumococcal Polysaccharide-23 08/23/2005   Td 02/02/2017   Tdap 08/23/2005, 07/13/2021   Zoster Recombinant(Shingrix) 04/30/2020, 07/03/2020     Objective: Vital Signs: BP (!) 167/85 (BP Location: Left Arm, Patient Position: Sitting, Cuff Size: Normal)   Pulse (!) 101   Resp 14   Ht 5\' 8"  (1.727 m)   Wt 234 lb (106.1 kg)   BMI 35.58 kg/m    Physical Exam HENT:     Mouth/Throat:     Mouth: Mucous membranes are moist.     Pharynx: Oropharynx is clear.  Eyes:     Conjunctiva/sclera: Conjunctivae normal.  Neck:     Comments: Large right-sided thyroid nodule firm and nontender No palpable lymphadenopathy Cardiovascular:     Rate and Rhythm: Regular rhythm. Tachycardia  present.     Heart sounds: Murmur heard.     Comments: Systolic murmur readily audible at both base and apex Pulmonary:     Effort: Pulmonary effort is normal.     Breath sounds: Normal breath sounds.  Musculoskeletal:     Left lower leg: Edema present.     Comments: 1+ pitting pedal edema left leg Bilateral varicose veins  Skin:    General: Skin is warm and dry.  Neurological:     Mental Status: She is alert.  Psychiatric:        Mood and Affect: Mood normal.      Musculoskeletal Exam:  Shoulders full ROM no tenderness or swelling Elbows full ROM no tenderness or swelling Wrists full ROM no tenderness or swelling Fingers full ROM no tenderness or swelling Knees full ROM no tenderness or swelling   Investigation: No additional findings.  Imaging: No results found.  Recent Labs: Lab Results  Component Value Date   WBC 7.0 10/31/2020   HGB 13.0 10/31/2020   PLT 239.0 10/31/2020   NA 140 01/04/2023   K 3.8 01/04/2023   CL 105 01/04/2023   CO2 28 01/04/2023   GLUCOSE  105 (H) 01/04/2023   BUN 13 01/04/2023   CREATININE 0.86 01/04/2023   BILITOT 0.5 01/04/2023   ALKPHOS 65 01/04/2023   AST 17 01/04/2023   ALT 16 01/04/2023   PROT 7.1 01/04/2023   ALBUMIN 4.2 01/04/2023   CALCIUM 9.4 01/04/2023    Speciality Comments: No specialty comments available.  Procedures:  No procedures performed Allergies: Naproxen sodium   Assessment / Plan:     Visit Diagnoses: Lymphadenopathy - Plan: Angiotensin converting enzyme, Sedimentation rate, C-reactive protein, Rheumatoid factor, ANA Vitamin D deficiency - Plan: Vitamin D 1,25 dihydroxy Abnormal laboratory test result  Cervical Lymphadenopathy Biopsy suggestive of granulomatous inflammation, possibly due to sarcoidosis or past infection. No current respiratory symptoms or skin rashes. I do not see any evidence of systemic diease at this time. -Order blood tests including ACE level, Vitamin D level, markers of  inflammation, rheumatoid factor, and ANA test. -Order chest x-ray to assess for nodules in the chest.  Heart Murmur Noted on physical examination. Patient reports previous cardiac ultrasound two years ago with normal results.  On my review this indicated some left ventricular outflow tract obstruction not clinically significant at the time. -Will reach out to PCP about follow-up or could refer to cardiology may need repeat echo due to new murmur  Lower Extremity Edema Noted in left leg, worsens with travel to [place]. Possible varicose veins contributing to edema. -Consider referral to vascular specialist for evaluation and possible Doppler study.  Osteoarthritis Noted on physical examination. No current joint pain or stiffness. -No additional intervention needed at this time.    Orders: Orders Placed This Encounter  Procedures   DG Chest 2 View   Angiotensin converting enzyme   Sedimentation rate   C-reactive protein   Rheumatoid factor   Vitamin D 1,25 dihydroxy   ANA   No orders of the defined types were placed in this encounter.    Follow-Up Instructions: No follow-ups on file.   Fuller Plan, MD  Note - This record has been created using [employer].  Chart creation errors have been sought, but may not always  have been located. Such creation errors do not reflect on  the standard of medical care.

## 2023-07-28 ENCOUNTER — Ambulatory Visit: Payer: Medicaid Other | Attending: Internal Medicine | Admitting: Internal Medicine

## 2023-07-28 ENCOUNTER — Encounter: Payer: Self-pay | Admitting: Internal Medicine

## 2023-07-28 ENCOUNTER — Telehealth: Payer: Self-pay | Admitting: Family

## 2023-07-28 ENCOUNTER — Ambulatory Visit
Admission: RE | Admit: 2023-07-28 | Discharge: 2023-07-28 | Disposition: A | Discharge status: Home / Self Care | Payer: Medicaid Other | Source: Ambulatory Visit | Attending: Internal Medicine | Admitting: Internal Medicine

## 2023-07-28 VITALS — BP 167/85 | HR 101 | Resp 14 | Ht 68.0 in | Wt 234.0 lb

## 2023-07-28 DIAGNOSIS — R591 Generalized enlarged lymph nodes: Secondary | ICD-10-CM

## 2023-07-28 DIAGNOSIS — R899 Unspecified abnormal finding in specimens from other organs, systems and tissues: Secondary | ICD-10-CM | POA: Diagnosis not present

## 2023-07-28 DIAGNOSIS — R590 Localized enlarged lymph nodes: Secondary | ICD-10-CM | POA: Diagnosis not present

## 2023-07-28 DIAGNOSIS — E559 Vitamin D deficiency, unspecified: Secondary | ICD-10-CM

## 2023-07-28 DIAGNOSIS — R011 Cardiac murmur, unspecified: Secondary | ICD-10-CM | POA: Insufficient documentation

## 2023-07-28 NOTE — Telephone Encounter (Signed)
Rheumatologist noted a new murmur.  I would like for her to complete a heart ultrasound. Order has been placed.

## 2023-07-28 NOTE — Patient Instructions (Signed)
I will reach out to your PCP about heart murmur, or if I do not hear back could refer you to cardiology for evaluation.  Please go to have your chest xray at [phone] [employer].

## 2023-07-29 NOTE — Telephone Encounter (Signed)
Patient notified of this information and orders for heart [place]

## 2023-08-02 LAB — VITAMIN D 1,25 DIHYDROXY
Vitamin D 1, 25 (OH)2 Total: 42 pg/mL (ref 18–72)
Vitamin D2 1, 25 (OH)2: <8 pg/mL
Vitamin D3 1, 25 (OH)2: 42 pg/mL

## 2023-08-02 LAB — RHEUMATOID FACTOR: Rheumatoid fact SerPl-aCnc: <10 [IU]/mL (ref <14)

## 2023-08-02 LAB — C-REACTIVE PROTEIN: CRP: <3 mg/L (ref <8.0)

## 2023-08-02 LAB — ANGIOTENSIN CONVERTING ENZYME: Angiotensin-Converting Enzyme: 17 U/L (ref 9–67)

## 2023-08-02 LAB — SEDIMENTATION RATE: Sed Rate: 22 mm/h (ref 0–30)

## 2023-08-02 LAB — ANA: Anti Nuclear Antibody (ANA): NEGATIVE

## 2023-08-03 ENCOUNTER — Ambulatory Visit
Admission: RE | Admit: 2023-08-03 | Discharge: 2023-08-03 | Disposition: A | Discharge status: Home / Self Care | Payer: Medicaid Other | Source: Ambulatory Visit | Attending: Family | Admitting: Family

## 2023-08-03 ENCOUNTER — Encounter: Payer: Self-pay | Admitting: Family

## 2023-08-03 ENCOUNTER — Other Ambulatory Visit: Payer: Self-pay | Admitting: Family

## 2023-08-03 DIAGNOSIS — N6325 Unspecified lump in the left breast, overlapping quadrants: Secondary | ICD-10-CM | POA: Diagnosis not present

## 2023-08-03 DIAGNOSIS — N6320 Unspecified lump in the left breast, unspecified quadrant: Secondary | ICD-10-CM

## 2023-08-03 DIAGNOSIS — N6002 Solitary cyst of left breast: Secondary | ICD-10-CM | POA: Diagnosis not present

## 2023-08-03 DIAGNOSIS — N6322 Unspecified lump in the left breast, upper inner quadrant: Secondary | ICD-10-CM | POA: Diagnosis not present

## 2023-08-03 DIAGNOSIS — R92323 Mammographic fibroglandular density, bilateral breasts: Secondary | ICD-10-CM | POA: Diagnosis not present

## 2023-08-15 ENCOUNTER — Encounter: Payer: Medicaid Other | Admitting: Family

## 2023-08-18 ENCOUNTER — Ambulatory Visit (HOSPITAL_BASED_OUTPATIENT_CLINIC_OR_DEPARTMENT_OTHER)
Admission: RE | Admit: 2023-08-18 | Discharge: 2023-08-18 | Disposition: A | Discharge status: Home / Self Care | Payer: Medicaid Other | Source: Ambulatory Visit | Attending: Family | Admitting: Family

## 2023-08-18 DIAGNOSIS — R011 Cardiac murmur, unspecified: Secondary | ICD-10-CM | POA: Insufficient documentation

## 2023-08-19 LAB — ECHOCARDIOGRAM COMPLETE
AR max vel: 1.72 cm2
AV Area VTI: 1.16 cm2
AV Area mean vel: 1.42 cm2
AV Mean grad: 7 mm[Hg]
AV Peak grad: 12.2 mm[Hg]
Ao pk vel: 1.75 m/s
Area-P 1/2: 3.83 cm2
Calc EF: 72.7 %
MV M vel: 2.86 m/s
MV Peak grad: 32.7 mm[Hg]
S' Lateral: 2.1 cm
Single Plane A2C EF: 69.7 %
Single Plane A4C EF: 74.3 %

## 2023-08-31 ENCOUNTER — Encounter: Payer: Medicaid Other | Admitting: Family

## 2023-09-06 IMAGING — US US THYROID
1 series · 13 of 25 positions shown · non-contrast
Comparison: CT June 2021

CLINICAL DATA: Incidental on CT.

EXAM:
THYROID ULTRASOUND
TECHNIQUE: Ultrasound examination of the thyroid gland and adjacent soft
tissues was performed.

[Series 1: us thyroid · 13 of 47 slices shown]
[im 1/47]
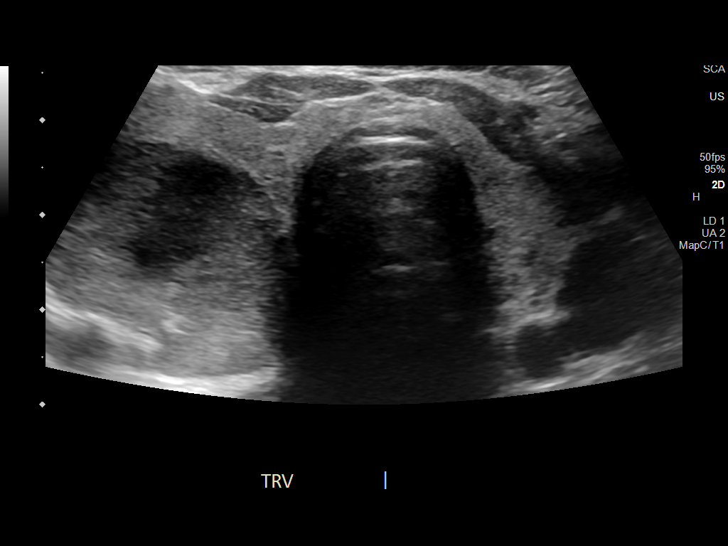
[im 4/47]
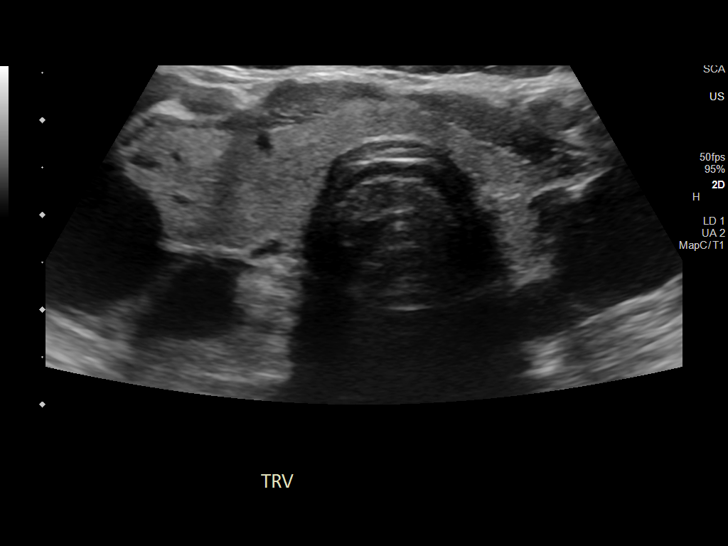
[im 8/47]
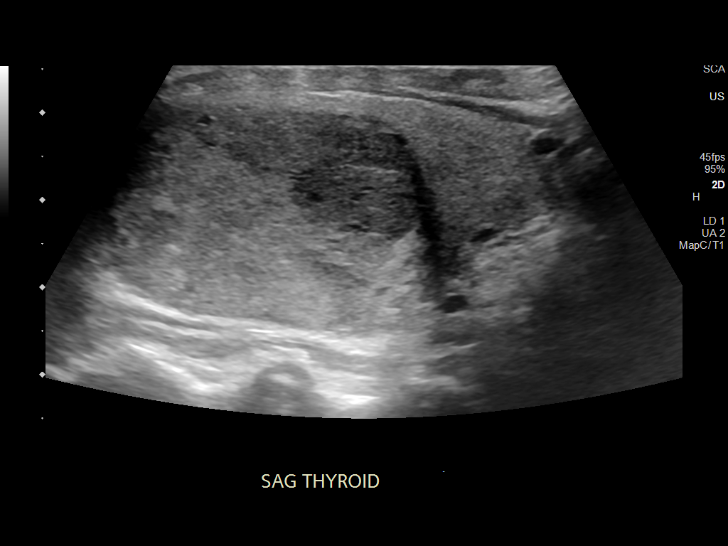
[im 12/47]
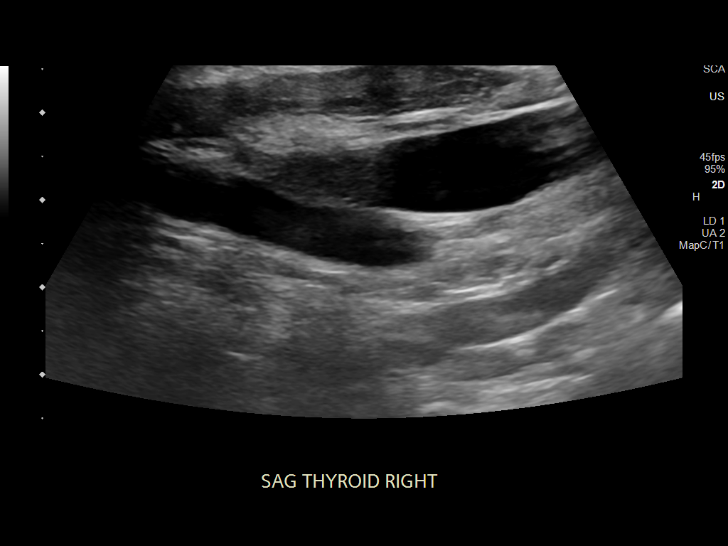
[im 16/47]
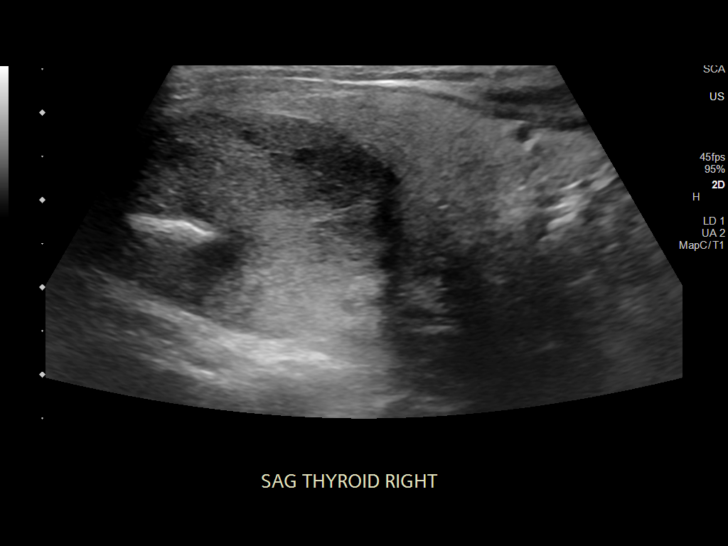
[im 20/47]
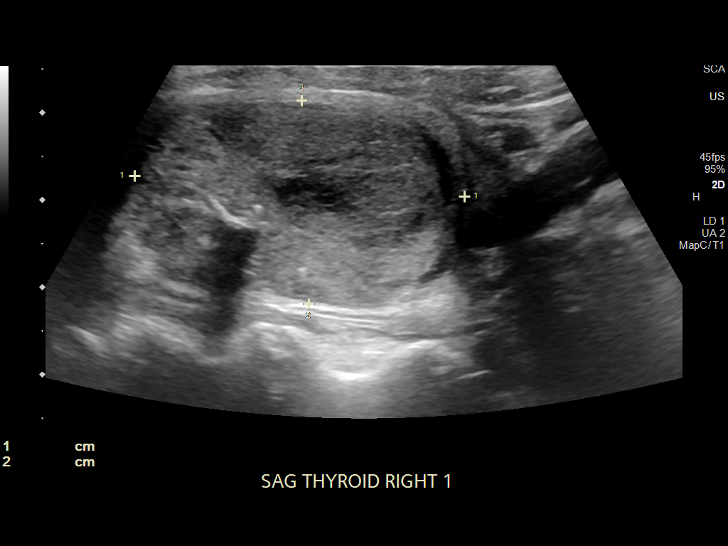
[im 24/47]
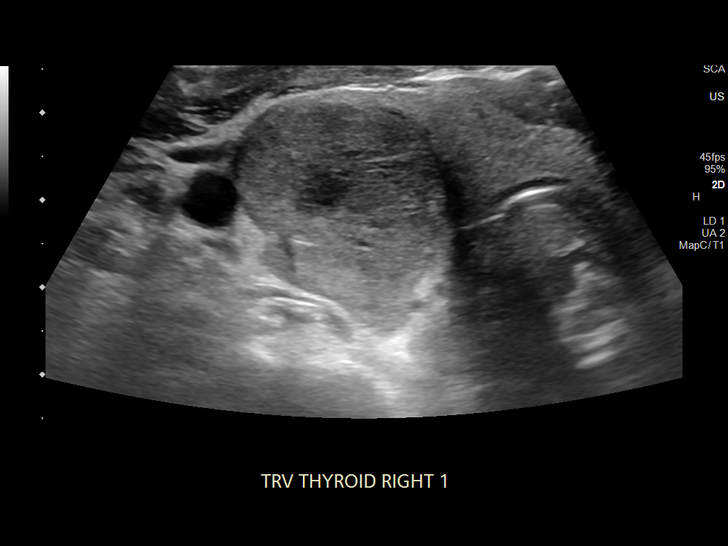
[im 27/47]
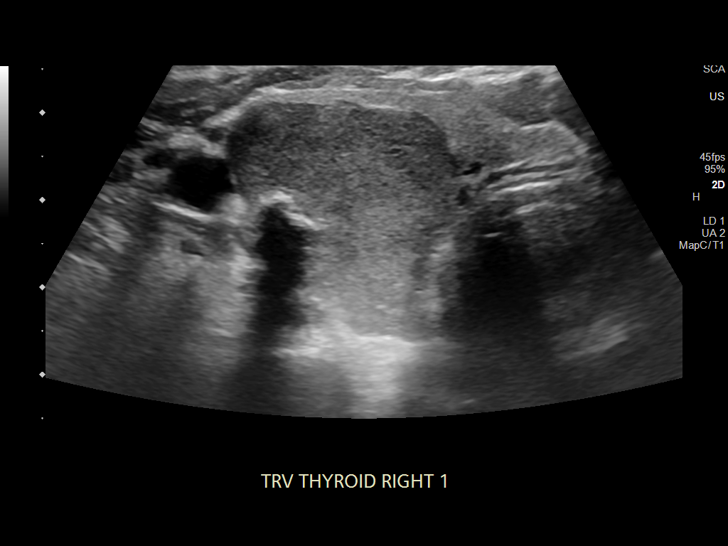
[im 31/47]
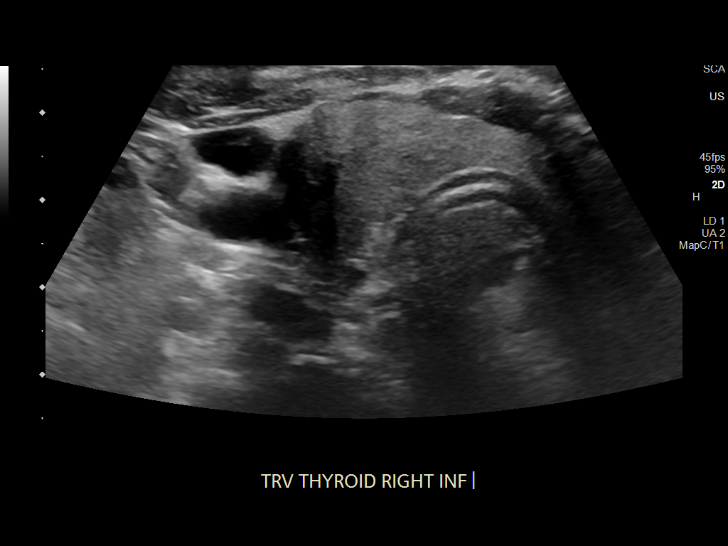
[im 35/47]
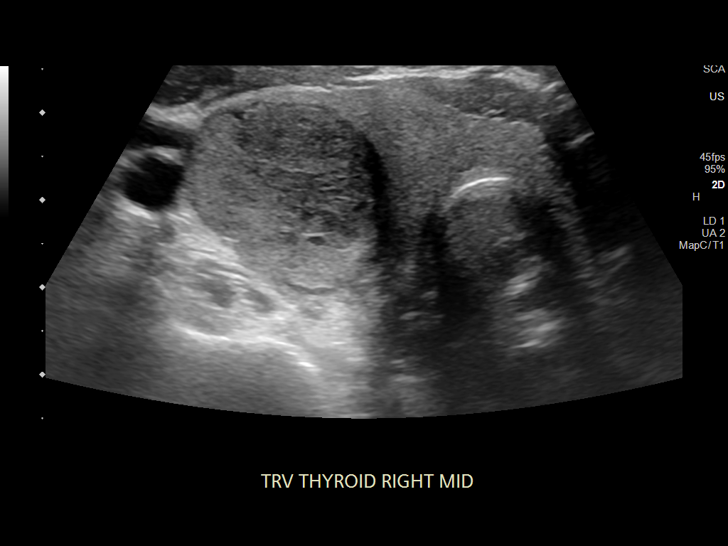
[im 39/47]
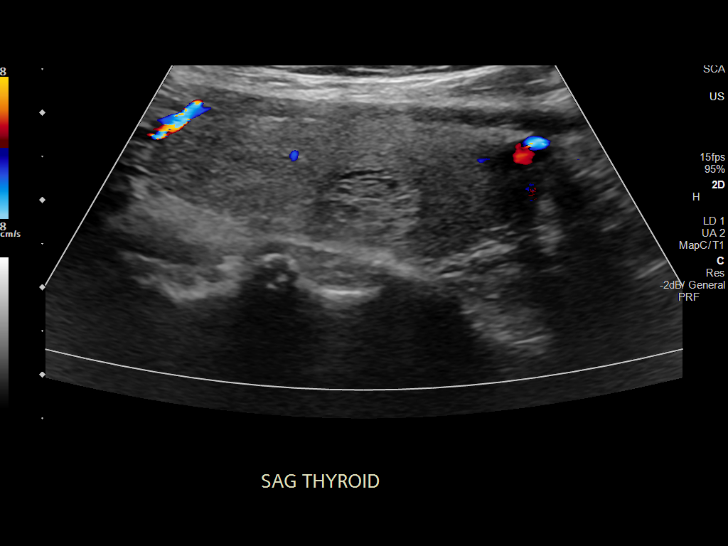
[im 43/47]
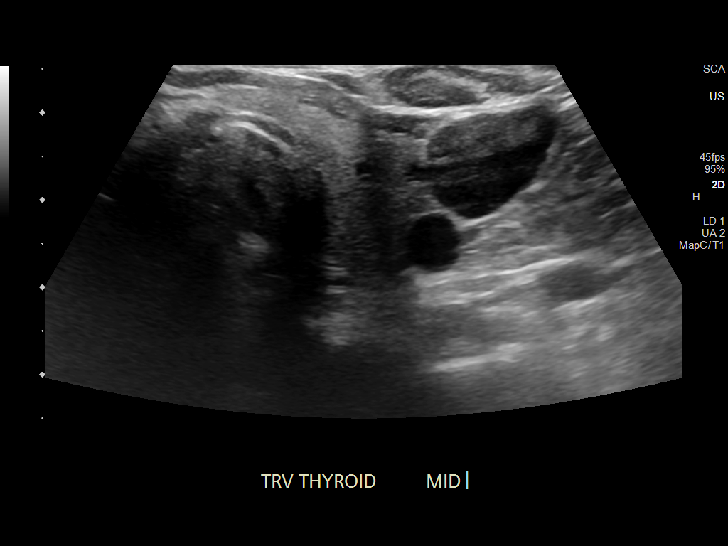
[im 47/47]
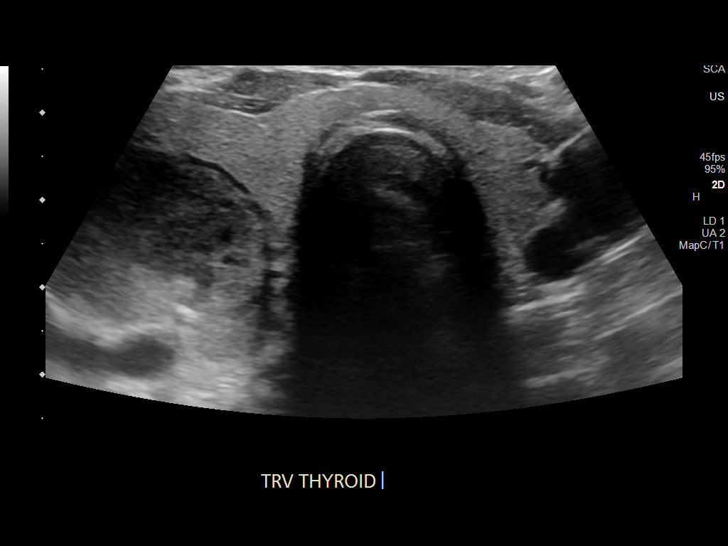

[13 of 25 positions shown; findings below may reference images not displayed]

FINDINGS: Parenchymal Echotexture: Mildly heterogenous

Isthmus: 0.5 cm

Right lobe: 5.6 x 2.8 x 3.0 cm

Left lobe: 4.4 x 1.7 x 1.2 cm

_________________________________________________________

Estimated total number of nodules >/= 1 cm: 1

Number of spongiform nodules >/=  2 cm not described below (TR1): 0

Number of mixed cystic and solid nodules >/= 1.5 cm not described
below (TR2): 0

_________________________________________________________

Nodule labeled 1 is a solid hypoechoic nodule with scattered macro
calcifications (TR 4) in the right thyroid lobe measuring 3.8 x
x 2.6 cm. **Given size (>/= 1.5 cm) and appearance, fine needle
aspiration of this moderately suspicious nodule should be considered
based on TI-RADS criteria.

Note is made of a second unlabeled nodule in the left thyroid lobe
which appears spongiform and measures approximately 1 cm in size.
This nodule does NOT meet TI-RADS criteria for biopsy or dedicated
follow-up.
IMPRESSION: 1. Multinodular thyroid gland.
2. Nodule labeled 1 in the right thyroid lobe meets criteria for
biopsy.

The above is in keeping with the ACR TI-RADS recommendations - [HOSPITAL] 6816;[DATE].

## 2023-09-07 ENCOUNTER — Encounter: Payer: Self-pay | Admitting: Family

## 2023-09-07 ENCOUNTER — Ambulatory Visit: Payer: Medicaid Other | Admitting: Family

## 2023-09-07 VITALS — BP 158/90 | HR 94 | Temp 98.6°F | Resp 16 | Ht 68.0 in | Wt 233.0 lb

## 2023-09-07 DIAGNOSIS — Z0000 Encounter for general adult medical examination without abnormal findings: Secondary | ICD-10-CM

## 2023-09-07 DIAGNOSIS — E041 Nontoxic single thyroid nodule: Secondary | ICD-10-CM

## 2023-09-07 DIAGNOSIS — R252 Cramp and spasm: Secondary | ICD-10-CM

## 2023-09-07 DIAGNOSIS — E119 Type 2 diabetes mellitus without complications: Secondary | ICD-10-CM | POA: Diagnosis not present

## 2023-09-07 DIAGNOSIS — I10 Essential (primary) hypertension: Secondary | ICD-10-CM

## 2023-09-07 DIAGNOSIS — E559 Vitamin D deficiency, unspecified: Secondary | ICD-10-CM

## 2023-09-07 DIAGNOSIS — R011 Cardiac murmur, unspecified: Secondary | ICD-10-CM | POA: Diagnosis not present

## 2023-09-07 DIAGNOSIS — E785 Hyperlipidemia, unspecified: Secondary | ICD-10-CM

## 2023-09-07 DIAGNOSIS — R590 Localized enlarged lymph nodes: Secondary | ICD-10-CM | POA: Diagnosis not present

## 2023-09-07 LAB — COMPREHENSIVE METABOLIC PANEL
ALT: 18 U/L (ref 0–35)
AST: 18 U/L (ref 0–37)
Albumin: 4.6 g/dL (ref 3.5–5.2)
Alkaline Phosphatase: 77 U/L (ref 39–117)
BUN: 16 mg/dL (ref 6–23)
CO2: 29 meq/L (ref 19–32)
Calcium: 9.7 mg/dL (ref 8.4–10.5)
Chloride: 105 meq/L (ref 96–112)
Creatinine, Ser: 0.72 mg/dL (ref 0.40–1.20)
GFR: 93.22 mL/min (ref >60.00)
Glucose, Bld: 112 mg/dL — ABNORMAL HIGH (ref 70–99)
Potassium: 4.4 meq/L (ref 3.5–5.1)
Sodium: 142 meq/L (ref 135–145)
Total Bilirubin: 0.5 mg/dL (ref 0.2–1.2)
Total Protein: 7.6 g/dL (ref 6.0–8.3)

## 2023-09-07 LAB — HEMOGLOBIN A1C: Hgb A1c MFr Bld: 6.7 % — ABNORMAL HIGH (ref 4.6–6.5)

## 2023-09-07 LAB — MAGNESIUM: Magnesium: 2.4 mg/dL (ref 1.5–2.5)

## 2023-09-07 LAB — TSH: TSH: 1.06 u[IU]/mL (ref 0.35–5.50)

## 2023-09-07 MED ORDER — LOSARTAN POTASSIUM 50 MG PO TABS: 50.0000 mg | ORAL_TABLET | Freq: Every day | ORAL | 0 refills | Status: DC

## 2023-09-07 NOTE — Assessment & Plan Note (Signed)
 Right supraclavicular LN noted to be enlarged on thyroid  US . She was referred back to surgeon and a core needle biopsy was performed which noted:  LYMPH NODE, RIGHT CERVICAL, NEEDLE CORE BIOPSY:  Non-necrotizing granulomas.  Negative for metastatic carcinoma.

## 2023-09-07 NOTE — Assessment & Plan Note (Signed)
 Valves looked good on echo 12/24.

## 2023-09-07 NOTE — Progress Notes (Signed)
 Subjective:     Patient ID: Leslie Morrow, female    DOB: 1966/08/02, 58 y.o.   MRN: [id number]  Chief Complaint  Patient presents with   Annual Exam    HPI  Discussed the use of AI scribe software for clinical note transcription with the patient, who gave verbal consent to proceed.  History of Present Illness   The patient, with a history of hypertension, hyperlipidemia, and thyroid  nodules, presents for a routine physical. She reports experiencing cramps in her left leg at night, but denies any discomfort during the day. She has not noticed any pattern or specific triggers for these cramps. She is currently taking vitamin D  and calcium  supplements. She denies any other new or concerning symptoms.  The patient has not received a flu shot this year and declines to do so at this time. She reports a healthy diet, but admits to a lack of regular exercise. She has completed colon cancer screening with Cologuard and her last Pap smear was in December. She had an eye exam last year while in [place], but has not had one this year. She has a history of thyroid  nodules, which were stable on last ultrasound, and a normal heart ultrasound in December. She is currently taking losartan  25mg , amlodipine  5mg , and Lipitor for hypertension and hyperlipidemia.      Immunizations: declines flu shot Diet: Wt Readings from Last 3 Encounters:  09/07/23 233 lb (105.7 kg)  07/28/23 234 lb (106.1 kg)  02/18/23 235 lb (106.6 kg)   Reports diet is healthy Exercise:  not exercising.  Colonoscopy: cologuard Pap Smear: due 9/26 Mammogram: 12/24 Vision: had it done in [place]    Health Maintenance Due  Topic Date Due   Pneumococcal Vaccine 73-78 Years old (2 of 2 - PCV) 08/23/2006   COVID-19 Vaccine (4 - 2024-25 season) 04/24/2023   OPHTHALMOLOGY EXAM  05/24/2023   HEMOGLOBIN A1C  07/07/2023    Past Medical History:  Diagnosis Date   Diabetes type 2, controlled (HCC)    Hyperlipidemia 02/26/2021    Hypertension    Thyroid  nodule 2023   right thyroid  FNA, notes Bethesda III, benign nodule.    Past Surgical History:  Procedure Laterality Date   BIOPSY THYROID  Right 2023   benign   CHOLECYSTECTOMY  2019   HERNIA REPAIR  2019   ventral hernia    Family History  Problem Relation Age of Onset   Hypertension Mother    Diabetes Father    Diabetes Mellitus II Brother     Social History   Socioeconomic History   Marital status: Married    Spouse name: Event organiser   Number of children: 2   Years of education: Not on file   Highest education level: Not on file  Occupational History   Occupation: unemployed  Tobacco Use   Smoking status: Never    Passive exposure: Current   Smokeless tobacco: Never  Vaping Use   Vaping status: Never Used  Substance and Sexual Activity   Alcohol use: Never   Drug use: Never   Sexual activity: Not Currently  Other Topics Concern   Not on file  Social History Narrative   Married   2 children son and daughter- both grown, expecting first grandchild in May   She is from [place], moved to [place]  age 7   Housewife   Completed HS equivalent in [place]   Social Drivers of Corporate investment banker Strain: Not on [employer] Insecurity: Not on  file  Transportation Needs: Not on file  Physical Activity: Not on file  Stress: Not on file  Social Connections: Not on file  Intimate Partner Violence: Not on file    Outpatient Medications Prior to Visit  Medication Sig Dispense Refill   amLODipine  (NORVASC ) 5 MG tablet Take 1 tablet (5 mg total) by mouth daily. 30 tablet 0   atorvastatin  (LIPITOR) 10 MG tablet Take 1 tablet (10 mg total) by mouth daily. 90 tablet 1   Calcium  Carb-Cholecalciferol 339 300 8464 MG-UNIT CAPS Take by mouth.     [facility] EVENING PRIMROSE OIL PO Take by mouth.     meloxicam  (MOBIC ) 7.5 MG tablet Take 1 tablet (7.5 mg total) by mouth daily. 30 tablet 0   MILK THISTLE PO Take by mouth.     Multiple Vitamin (MULTI VITAMIN  PO) Take by mouth.     losartan  (COZAAR ) 25 MG tablet Take 1 tablet (25 mg total) by mouth daily. 30 tablet 0   No facility-administered medications prior to visit.    Allergies  Allergen Reactions   Naproxen Sodium     Other reaction(s): Other (See Comments) heartburn heartburn heartburn     Review of Systems  Constitutional:  Negative for weight loss.  HENT:  Negative for congestion and hearing loss.   Eyes:  Negative for blurred vision.  Respiratory:  Positive for cough (mild).   Cardiovascular:  Negative for leg swelling.  Gastrointestinal:  Negative for constipation and diarrhea.  Genitourinary:  Negative for dysuria and frequency.  Musculoskeletal:  Positive for myalgias (rare cramping in legs (every 6 months)).  Skin:  Negative for rash.  Neurological:  Negative for headaches.  Psychiatric/Behavioral:         Denies depression/anxiety       Objective:    Physical Exam   BP (!) 158/90   Pulse 94   Temp 98.6 F (37 C) (Oral)   Resp 16   Ht 5\' 8"  (1.727 m)   Wt 233 lb (105.7 kg)   SpO2 100%   BMI 35.43 kg/m  Wt Readings from Last 3 Encounters:  09/07/23 233 lb (105.7 kg)  07/28/23 234 lb (106.1 kg)  02/18/23 235 lb (106.6 kg)   Physical Exam  Constitutional: She is oriented to person, place, and time. She appears well-developed and well-nourished. No distress.  HENT:  Head: Normocephalic and atraumatic.  Right Ear: Tympanic membrane and ear canal normal.  Left Ear: Tympanic membrane and ear canal normal.  Mouth/Throat: Oropharynx is clear and moist.  Eyes: Pupils are equal, round, and reactive to light. No scleral icterus.  Neck: Normal range of motion. No thyromegaly present.  Cardiovascular: Normal rate and regular rhythm.   + murmur heard. Pulmonary/Chest: Effort normal and breath sounds normal. No respiratory distress. He has no wheezes. She has no rales. She exhibits no tenderness.  Abdominal: Soft. Bowel sounds are normal. She exhibits no  distension and no mass. There is no tenderness. There is no rebound and no guarding.  Musculoskeletal: She exhibits no edema.  Lymphadenopathy:    She has no cervical adenopathy.  Neurological: She is alert and oriented to person, place, and time. She has normal patellar reflexes. She exhibits normal muscle tone. Coordination normal.  Skin: Skin is warm and dry.  Psychiatric: She has a normal mood and affect. Her behavior is normal. Judgment and thought content normal.  Breast/pelvic: deferred          Assessment & Plan:       Assessment &  Plan:   Problem List Items Addressed This Visit       Unprioritized   Vitamin D  deficiency   She is taking caltrate + D. Update vit d level.       Thyroid  nodule   Repeat thyroid  US  5/24 stable.  Negative Afirma,   US  did not enlarged right supraclavicular node which prompted       Relevant Orders   TSH   Murmur   Valves looked good on echo 12/24.        Hypertension   BP Readings from Last 3 Encounters:  09/07/23 (!) 146/94  07/28/23 (!) 167/85  02/18/23 (!) 145/78   Maintained on losartan  and amlodipine  5mg .       Relevant Medications   losartan  (COZAAR ) 50 MG tablet   Hyperlipidemia   Lab Results  Component Value Date   CHOL 177 01/04/2023   HDL 55.80 01/04/2023   LDLCALC 89 01/04/2023   TRIG 160.0 (H) 01/04/2023   CHOLHDL 3 01/04/2023   LDL stable on atorvastatin .       Relevant Medications   losartan  (COZAAR ) 50 MG tablet   Enlarged lymph node in neck   Right supraclavicular LN noted to be enlarged on thyroid  US . She was referred back to surgeon and a core needle biopsy was performed which noted:  LYMPH NODE, RIGHT CERVICAL, NEEDLE CORE BIOPSY:  Non-necrotizing granulomas.  Negative for metastatic carcinoma.        Diabetes type 2, controlled (HCC)   Lab Results  Component Value Date   HGBA1C 6.5 01/04/2023   HGBA1C 6.5 03/12/2022   HGBA1C 6.2 09/14/2021   Lab Results  Component Value Date    MICROALBUR <0.7 01/04/2023   LDLCALC 89 01/04/2023   CREATININE 0.86 01/04/2023         Relevant Medications   losartan  (COZAAR ) 50 MG tablet   Other Relevant Orders   HgB A1c   Comp Met (CMET)   Other Visit Diagnoses       Leg cramp    -  Primary   Relevant Orders   Magnesium       I have discontinued Kanesha Riddle's losartan . I am also having her start on losartan . Additionally, I am having her maintain her Calcium  Carb-Cholecalciferol, atorvastatin , meloxicam , [facility] EVENING PRIMROSE OIL PO, MILK THISTLE PO, Multiple Vitamin (MULTI VITAMIN PO), and amLODipine .  Meds ordered this encounter  Medications   losartan  (COZAAR ) 50 MG tablet    Sig: Take 1 tablet (50 mg total) by mouth daily.    Dispense:  90 tablet    Refill:  0    Supervising Provider:   Randie Bustle A [[id number]]

## 2023-09-07 NOTE — Assessment & Plan Note (Signed)
 She is taking caltrate + D. Update vit d level.

## 2023-09-07 NOTE — Patient Instructions (Signed)
 VISIT SUMMARY:  During your routine physical, we discussed your current health status and addressed your concerns. You mentioned experiencing cramps in your left leg at night, and we reviewed your history of hypertension, hyperlipidemia, and thyroid  nodules. We also discussed your general health maintenance, including vaccinations, exercise, and screenings.  YOUR PLAN:  -HYPERTENSION: Hypertension, or high blood pressure, means that the force of the blood against your artery walls is too high. We noted elevated blood pressure readings today and in December. We have increased your Losartan  dose to 50mg  daily and will recheck your blood pressure in 2 weeks.  -NOCTURNAL LEG CRAMPS: Nocturnal leg cramps are sudden, involuntary contractions of the leg muscles at night. We will check your serum magnesium and vitamin D  levels to determine if there is a deficiency causing these cramps.  -THYROID  NODULE: A thyroid  nodule is a growth within the thyroid  gland. Your thyroid  nodule remains stable, but we noted an enlarged thyroid  on physical exam. We will check your thyroid  function tests to ensure everything is normal.  -GENERAL HEALTH MAINTENANCE: We discussed several aspects of your general health. You declined the influenza vaccine but were advised to consider a COVID booster at a local pharmacy. We encourage you to walk for 30 minutes, 5 days a week, and to have an updated eye exam this year. Continue taking your Calcium  plus Vitamin D  supplements. Your colon cancer screening is up to date, and your next Pap smear is due in September 2026.  INSTRUCTIONS:  Please recheck your blood pressure in 2 weeks. Schedule an appointment for an updated eye exam this year. Follow up with us  in 4 months for your next routine check-up.

## 2023-09-07 NOTE — Assessment & Plan Note (Addendum)
 BP Readings from Last 3 Encounters:  09/07/23 (!) 146/94  07/28/23 (!) 167/85  02/18/23 (!) 145/78   Maintained on losartan  25mg  and amlodipine  5mg .  Above goal. Increase losartan  to 50mg .

## 2023-09-07 NOTE — Assessment & Plan Note (Signed)
 Repeat thyroid  US  5/24 stable.  Negative Afirma,   US  did not enlarged right supraclavicular node which prompted

## 2023-09-07 NOTE — Assessment & Plan Note (Signed)
-  Declined influenza vaccine. -Advised to consider COVID booster at local pharmacy. -Encouraged 30 minutes of walking 5 days a week. -Advised to have an updated eye exam this year. -Continue Calcium  plus Vitamin D  supplementation. -Colon cancer screening up to date with Cologuard. -Pap smear due in September 2026. -Follow-up in 4 months.

## 2023-09-07 NOTE — Assessment & Plan Note (Signed)
 Lab Results  Component Value Date   HGBA1C 6.5 01/04/2023   HGBA1C 6.5 03/12/2022   HGBA1C 6.2 09/14/2021   Lab Results  Component Value Date   MICROALBUR <0.7 01/04/2023   LDLCALC 89 01/04/2023   CREATININE 0.86 01/04/2023

## 2023-09-07 NOTE — Assessment & Plan Note (Signed)
 Lab Results  Component Value Date   CHOL 177 01/04/2023   HDL 55.80 01/04/2023   LDLCALC 89 01/04/2023   TRIG 160.0 (H) 01/04/2023   CHOLHDL 3 01/04/2023   LDL stable on atorvastatin .

## 2023-09-08 ENCOUNTER — Other Ambulatory Visit: Payer: Self-pay | Admitting: Family

## 2023-09-08 DIAGNOSIS — E785 Hyperlipidemia, unspecified: Secondary | ICD-10-CM

## 2023-12-05 ENCOUNTER — Encounter: Payer: Self-pay | Admitting: Family

## 2023-12-05 ENCOUNTER — Other Ambulatory Visit: Payer: Self-pay | Admitting: Family

## 2023-12-05 DIAGNOSIS — E785 Hyperlipidemia, unspecified: Secondary | ICD-10-CM

## 2023-12-06 NOTE — Telephone Encounter (Signed)
Pleasec o

## 2023-12-07 ENCOUNTER — Other Ambulatory Visit: Payer: Self-pay

## 2023-12-07 DIAGNOSIS — E785 Hyperlipidemia, unspecified: Secondary | ICD-10-CM

## 2023-12-07 MED ORDER — LOSARTAN POTASSIUM 50 MG PO TABS: 50.0000 mg | ORAL_TABLET | Freq: Every day | ORAL | 0 refills | Status: DC

## 2023-12-07 MED ORDER — AMLODIPINE BESYLATE 5 MG PO TABS: 5.0000 mg | ORAL_TABLET | Freq: Every day | ORAL | 0 refills | Status: DC

## 2023-12-07 MED ORDER — ATORVASTATIN CALCIUM 10 MG PO TABS
10.0000 mg | ORAL_TABLET | Freq: Every day | ORAL | 0 refills | Status: DC
Start: 2023-12-07 — End: 2023-12-29

## 2023-12-17 ENCOUNTER — Other Ambulatory Visit: Payer: Self-pay | Admitting: Family

## 2023-12-18 NOTE — Telephone Encounter (Signed)
 Please contact pt to schedule a follow up appointment.

## 2023-12-19 NOTE — Telephone Encounter (Signed)
 Line went busy. Unable to lvm.

## 2023-12-29 ENCOUNTER — Telehealth: Payer: Self-pay | Admitting: Family

## 2023-12-29 DIAGNOSIS — E785 Hyperlipidemia, unspecified: Secondary | ICD-10-CM

## 2023-12-29 MED ORDER — LOSARTAN POTASSIUM 50 MG PO TABS: 50.0000 mg | ORAL_TABLET | Freq: Every day | ORAL | 0 refills | Status: DC

## 2023-12-29 MED ORDER — ATORVASTATIN CALCIUM 10 MG PO TABS
10.0000 mg | ORAL_TABLET | Freq: Every day | ORAL | 0 refills | Status: DC
Start: 2023-12-29 — End: 2024-03-09

## 2023-12-29 MED ORDER — AMLODIPINE BESYLATE 5 MG PO TABS: 5.0000 mg | ORAL_TABLET | Freq: Every day | ORAL | 0 refills | Status: DC

## 2023-12-29 NOTE — Telephone Encounter (Signed)
 See mychart.

## 2024-02-02 ENCOUNTER — Ambulatory Visit
Admission: RE | Admit: 2024-02-02 | Discharge: 2024-02-02 | Disposition: A | Discharge status: Home / Self Care | Payer: Medicaid Other | Source: Ambulatory Visit | Attending: Family | Admitting: Family

## 2024-02-02 DIAGNOSIS — N6320 Unspecified lump in the left breast, unspecified quadrant: Secondary | ICD-10-CM

## 2024-02-02 DIAGNOSIS — N6322 Unspecified lump in the left breast, upper inner quadrant: Secondary | ICD-10-CM | POA: Diagnosis not present

## 2024-02-02 DIAGNOSIS — N6002 Solitary cyst of left breast: Secondary | ICD-10-CM | POA: Diagnosis not present

## 2024-02-02 DIAGNOSIS — N6325 Unspecified lump in the left breast, overlapping quadrants: Secondary | ICD-10-CM | POA: Diagnosis not present

## 2024-02-06 ENCOUNTER — Other Ambulatory Visit: Payer: Self-pay | Admitting: Family

## 2024-02-06 DIAGNOSIS — N6320 Unspecified lump in the left breast, unspecified quadrant: Secondary | ICD-10-CM

## 2024-03-09 ENCOUNTER — Ambulatory Visit: Admitting: Family

## 2024-03-09 ENCOUNTER — Telehealth: Payer: Self-pay | Admitting: Family

## 2024-03-09 VITALS — BP 123/72 | HR 73 | Temp 97.6°F | Resp 16 | Ht 68.0 in | Wt 239.0 lb

## 2024-03-09 DIAGNOSIS — E785 Hyperlipidemia, unspecified: Secondary | ICD-10-CM

## 2024-03-09 DIAGNOSIS — I10 Essential (primary) hypertension: Secondary | ICD-10-CM

## 2024-03-09 DIAGNOSIS — Z7985 Long-term (current) use of injectable non-insulin antidiabetic drugs: Secondary | ICD-10-CM

## 2024-03-09 DIAGNOSIS — E041 Nontoxic single thyroid nodule: Secondary | ICD-10-CM

## 2024-03-09 DIAGNOSIS — R590 Localized enlarged lymph nodes: Secondary | ICD-10-CM

## 2024-03-09 DIAGNOSIS — Z23 Encounter for immunization: Secondary | ICD-10-CM | POA: Diagnosis not present

## 2024-03-09 DIAGNOSIS — E119 Type 2 diabetes mellitus without complications: Secondary | ICD-10-CM

## 2024-03-09 DIAGNOSIS — M545 Low back pain, unspecified: Secondary | ICD-10-CM

## 2024-03-09 MED ORDER — METHOCARBAMOL 500 MG PO TABS: 500.0000 mg | ORAL_TABLET | Freq: Two times a day (BID) | ORAL | 1 refills | Status: AC | PRN

## 2024-03-09 MED ORDER — LOSARTAN POTASSIUM 50 MG PO TABS: 50.0000 mg | ORAL_TABLET | Freq: Every day | ORAL | 0 refills | Status: DC

## 2024-03-09 MED ORDER — AMLODIPINE BESYLATE 5 MG PO TABS: 5.0000 mg | ORAL_TABLET | Freq: Every day | ORAL | 0 refills | Status: DC

## 2024-03-09 MED ORDER — OZEMPIC (0.25 OR 0.5 MG/DOSE) 2 MG/1.5ML ~~LOC~~ SOPN
0.2500 mg | PEN_INJECTOR | SUBCUTANEOUS | 2 refills | Status: DC
Start: 2024-03-09 — End: 2024-04-30

## 2024-03-09 MED ORDER — ATORVASTATIN CALCIUM 10 MG PO TABS
10.0000 mg | ORAL_TABLET | Freq: Every day | ORAL | 0 refills | Status: DC
Start: 2024-03-09 — End: 2024-06-11

## 2024-03-09 NOTE — Assessment & Plan Note (Signed)
 Trial of ozempic for glycemic and weight control. Discussed common side effects.

## 2024-03-09 NOTE — Assessment & Plan Note (Signed)
  Chronic pain not managed with meloxicam . Discussed muscle relaxant use and potential drowsiness. Open to physical therapy or acupuncture. - Prescribe muscle relaxant for occasional use. - Advise on potential drowsiness with muscle relaxant. - Consider physical therapy if needed

## 2024-03-09 NOTE — Telephone Encounter (Signed)
 See mychart.

## 2024-03-09 NOTE — Assessment & Plan Note (Signed)
 Right supraclavicular LN noted to be enlarged on thyroid  US . She was referred back to surgeon and a core needle biopsy was performed which noted:  LYMPH NODE, RIGHT CERVICAL, NEEDLE CORE BIOPSY:  Non-necrotizing granulomas.  Negative for metastatic carcinoma.

## 2024-03-09 NOTE — Assessment & Plan Note (Signed)
 BP at goal, continue losartan and amlodipine.

## 2024-03-09 NOTE — Patient Instructions (Signed)
 VISIT SUMMARY:  You had a follow-up visit to manage your hypertension, diabetes, and other health concerns. Your blood pressure is well-controlled, and we discussed starting Ozempic for your diabetes and weight management. We also addressed your back pain and the need for a pneumonia vaccine.  YOUR PLAN:  TYPE 2 DIABETES MELLITUS: Your A1c was 6.7% in January, which is within the target range. We discussed starting Ozempic to help with weight loss and better blood sugar control. -Order an A1c test today. -Start Ozempic at a low dose once weekly. -Ozempic is a once-weekly injection that should be stored in the refrigerator. -Monitor for any side effects and adjust the dose as necessary.  HYPERTENSION: Your blood pressure is well-controlled with your current medications. -Continue taking amlodipine  5 mg daily. -Continue taking losartan  50 mg daily.  BACK PAIN: You have chronic back pain that is not managed with meloxicam . We discussed using a muscle relaxant and other therapies. -Prescribe a muscle relaxant for occasional use. -Be aware that the muscle relaxant may cause drowsiness. -Consider physical therapy or acupuncture if needed.  THYROID  NODULE: You have a thyroid  nodule that was previously identified.  Your biopsy results were negative of the thyroid  and the lymph node in your neck.  We will order a follow up ultrasound to monitor your thyroid .  GENERAL HEALTH MAINTENANCE: You are due for a pneumonia vaccine and have a mammogram follow-up scheduled. -Administer the pneumonia vaccine today.

## 2024-03-09 NOTE — Assessment & Plan Note (Signed)
 Hx of Bethesda III nodule with negative Afirma 2023.   Had negative biopsy of cervical LN 2024.   Pt would like repeat US .  Order has been placed.

## 2024-03-09 NOTE — Progress Notes (Signed)
 Subjective:     Patient ID: Leslie Morrow, female    DOB: April 17, 1966, 58 y.o.   MRN: [id number]  Chief Complaint  Patient presents with   Hypertension    Here for follow up   Diabetes    Here for follow up    HPI  Discussed the use of AI scribe software for clinical note transcription with the patient, who gave verbal consent to proceed.  History of Present Illness  Leslie Morrow is a 58 year old female with hypertension and diabetes who presents for a follow-up visit.  Her blood pressure is well-controlled with amlodipine  5 mg and losartan  50 mg. She manages her diabetes with diet alone, with a last A1c of 6.7 in January. She will have her A1c updated today. She notes recent weight gain, attributing it to dietary changes, and is interested in a diabetes medication that aids in weight loss, mentioning Ozempic.  She experiences back pain and shoulder discomfort, with meloxicam  proving ineffective. She is interested in a muscle relaxer for occasional use, having found previous physical therapy and acupuncture beneficial.  She is due for a pneumonia shot, having last received one in 2007. She declines the hepatitis B vaccine.     Health Maintenance Due  Topic Date Due   Diabetic kidney evaluation - Urine ACR  Never done   COVID-19 Vaccine (4 - 2024-25 season) 04/24/2023   OPHTHALMOLOGY EXAM  05/24/2023   HEMOGLOBIN A1C  03/06/2024    Past Medical History:  Diagnosis Date   Diabetes type 2, controlled (HCC)    Hyperlipidemia 02/26/2021   Hypertension    Thyroid  nodule 2023   right thyroid  FNA, notes Bethesda III, benign nodule.    Past Surgical History:  Procedure Laterality Date   BIOPSY THYROID  Right 2023   benign   CHOLECYSTECTOMY  2019   HERNIA REPAIR  2019   ventral hernia    Family History  Problem Relation Age of Onset   Hypertension Mother    Diabetes Father    Diabetes Mellitus II Brother     Social History   Socioeconomic History   Marital  status: Married    Spouse name: Event organiser   Number of children: 2   Years of education: Not on file   Highest education level: GED or equivalent  Occupational History   Occupation: unemployed  Tobacco Use   Smoking status: Never    Passive exposure: Current   Smokeless tobacco: Never  Vaping Use   Vaping status: Never Used  Substance and Sexual Activity   Alcohol use: Never   Drug use: Never   Sexual activity: Not Currently  Other Topics Concern   Not on file  Social History Narrative   Married   2 children son and daughter- both grown, expecting first grandchild in May   She is from [place], moved to [place]  age 47   Housewife   Completed HS equivalent in [place]   Social Drivers of Health   Financial Resource Strain: Low Risk  (03/07/2024)   Overall Financial Resource Strain (CARDIA)    Difficulty of Paying Living Expenses: Not hard at all  Food Insecurity: No Food Insecurity (03/07/2024)   Hunger Vital Sign    Worried About Running Out of Food in the Last Year: Never true    Ran Out of Food in the Last Year: Never true  Transportation Needs: No Transportation Needs (03/07/2024)   PRAPARE - Administrator, Civil Service (Medical): No  Lack of Transportation (Non-Medical): No  Physical Activity: Inactive (03/07/2024)   Exercise Vital Sign    Days of Exercise per Week: 0 days    Minutes of Exercise per Session: Not on file  Stress: No Stress Concern Present (03/07/2024)   [employer] of Occupational Health - Occupational Stress Questionnaire    Feeling of Stress: Not at all  Social Connections: Moderately Integrated (03/07/2024)   Social Connection and Isolation Panel    Frequency of Communication with Friends and Family: More than three times a week    Frequency of Social Gatherings with Friends and Family: Once a week    Attends Religious Services: 1 to 4 times per year    Active Member of [employer] or Organizations: No    Attends Hospital doctor: Not on file    Marital Status: Married  Catering manager Violence: Not on file    Outpatient Medications Prior to Visit  Medication Sig Dispense Refill   Calcium  Carb-Cholecalciferol [phone] MG-UNIT CAPS Take by mouth.     [facility] EVENING PRIMROSE OIL PO Take by mouth.     MILK THISTLE PO Take by mouth.     Multiple Vitamin (MULTI VITAMIN PO) Take by mouth.     amLODipine  (NORVASC ) 5 MG tablet Take 1 tablet (5 mg total) by mouth daily. 90 tablet 0   atorvastatin  (LIPITOR) 10 MG tablet Take 1 tablet (10 mg total) by mouth daily. 90 tablet 0   losartan  (COZAAR ) 50 MG tablet Take 1 tablet (50 mg total) by mouth daily. 90 tablet 0   meloxicam  (MOBIC ) 7.5 MG tablet Take 1 tablet (7.5 mg total) by mouth daily. 30 tablet 0   No facility-administered medications prior to visit.    Allergies  Allergen Reactions   Naproxen Sodium     Other reaction(s): Other (See Comments) heartburn heartburn heartburn     ROS     Objective:    Physical Exam Constitutional:      General: She is not in acute distress.    Appearance: Normal appearance. She is well-developed.  HENT:     Head: Normocephalic and atraumatic.     Right Ear: External ear normal.     Left Ear: External ear normal.  Eyes:     General: No scleral icterus. Neck:     Thyroid : Thyromegaly (bilateral, Most prominent on right) present.  Cardiovascular:     Rate and Rhythm: Normal rate and regular rhythm.     Heart sounds: Normal heart sounds. No murmur heard. Pulmonary:     Effort: Pulmonary effort is normal. No respiratory distress.     Breath sounds: Normal breath sounds. No wheezing.  Musculoskeletal:     Cervical back: Neck supple.  Skin:    General: Skin is warm and dry.  Neurological:     Mental Status: She is alert and oriented to person, place, and time.  Psychiatric:        Mood and Affect: Mood normal.        Behavior: Behavior normal.        Thought Content: Thought content normal.         Judgment: Judgment normal.      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   Had negative biopsy of cervical LN 2024.   Pt would like repeat US .  Order has been placed.      Relevant Orders   US  THYROID    Morbid obesity (HCC)   Trial of ozempic for glycemic and weight control. Discussed common side effects.      Relevant Medications   Semaglutide,0.25 or 0.5MG /DOS, (OZEMPIC, 0.25 OR 0.5 MG/DOSE,) 2 MG/1.5ML SOPN   Low back pain without sciatica    Chronic pain not managed with meloxicam . Discussed muscle relaxant use and potential drowsiness. Open to physical therapy or acupuncture. - Prescribe muscle relaxant for occasional use. - Advise on potential drowsiness with muscle relaxant. - Consider physical therapy if needed      Relevant Medications   methocarbamol (ROBAXIN) 500 MG tablet   Hypertension - Primary   BP at goal, continue losartan  and amlodipine .       Relevant Medications   atorvastatin  (LIPITOR) 10 MG tablet   losartan  (COZAAR ) 50 MG tablet   amLODipine  (NORVASC ) 5 MG tablet   Hyperlipidemia   Lab Results  Component Value Date   CHOL 177 01/04/2023   HDL 55.80 01/04/2023   LDLCALC 89 01/04/2023   TRIG 160.0 (H) 01/04/2023   CHOLHDL 3 01/04/2023  Due for follow up lipid panel. Continue atorvastatin .       Relevant Medications   atorvastatin  (LIPITOR) 10 MG tablet   losartan  (COZAAR ) 50 MG tablet   amLODipine  (NORVASC ) 5 MG tablet   Other Relevant Orders   Lipid panel   Enlarged lymph node in  neck   Right supraclavicular LN noted to be enlarged on thyroid  US . She was referred back to surgeon and a core needle biopsy was performed which noted:   LYMPH NODE, RIGHT CERVICAL, NEEDLE CORE BIOPSY:  Non-necrotizing granulomas.  Negative for metastatic carcinoma.       Diabetes type 2, controlled (HCC)   Lab Results  Component Value Date   HGBA1C 6.7 (H) 09/07/2023   HGBA1C 6.5 01/04/2023   HGBA1C 6.5 03/12/2022   Lab Results  Component Value Date   LDLCALC 89 01/04/2023   CREATININE 0.72 09/07/2023   Was at goal in January on diet alone. Update today. Add low dose ozempic for sugar and weight management.      Relevant Medications   Semaglutide,0.25 or 0.5MG /DOS, (OZEMPIC, 0.25 OR 0.5 MG/DOSE,) 2 MG/1.5ML SOPN   atorvastatin  (LIPITOR) 10 MG tablet   losartan  (COZAAR ) 50 MG tablet   Other Relevant Orders   HgB A1c   Urine Microalbumin w/creat. ratio   Basic Metabolic Panel (BMET)   Other Visit Diagnoses       Need for pneumococcal 20-valent conjugate vaccination       Relevant Orders   Pneumococcal conjugate vaccine 20-valent (Prevnar 20) (Completed)       I have discontinued Eliot Bachmann's meloxicam . I am also having her start on Ozempic (0.25 or 0.5 MG/DOSE) and methocarbamol. Additionally, I am having her maintain her Calcium  Carb-Cholecalciferol, [facility] EVENING PRIMROSE OIL PO, MILK THISTLE PO, Multiple Vitamin (MULTI VITAMIN PO), atorvastatin , losartan , and amLODipine .  Meds ordered this encounter  Medications  Semaglutide,0.25 or 0.5MG /DOS, (OZEMPIC, 0.25 OR 0.5 MG/DOSE,) 2 MG/1.5ML SOPN    Sig: Inject 0.25 mg into the skin once a week.    Dispense:  1.5 mL    Refill:  2    Supervising Provider:   DOMENICA BLACKBIRD A [[id number]]   methocarbamol (ROBAXIN) 500 MG tablet    Sig: Take 1 tablet (500 mg total) by mouth 2 (two) times daily as needed.    Dispense:  20 tablet    Refill:  1    Supervising Provider:   DOMENICA BLACKBIRD A [[id number]]   atorvastatin  (LIPITOR) 10 MG  tablet    Sig: Take 1 tablet (10 mg total) by mouth daily.    Dispense:  90 tablet    Refill:  0    Supervising Provider:   DOMENICA BLACKBIRD A [[id number]]   losartan  (COZAAR ) 50 MG tablet    Sig: Take 1 tablet (50 mg total) by mouth daily.    Dispense:  90 tablet    Refill:  0    Supervising Provider:   DOMENICA BLACKBIRD A [[id number]]   amLODipine  (NORVASC ) 5 MG tablet    Sig: Take 1 tablet (5 mg total) by mouth daily.    Dispense:  90 tablet    Refill:  0    Supervising Provider:   DOMENICA BLACKBIRD A [[id number]]

## 2024-03-09 NOTE — Assessment & Plan Note (Addendum)
 Lab Results  Component Value Date   HGBA1C 6.7 (H) 09/07/2023   HGBA1C 6.5 01/04/2023   HGBA1C 6.5 03/12/2022   Lab Results  Component Value Date   LDLCALC 89 01/04/2023   CREATININE 0.72 09/07/2023   Was at goal in January on diet alone. Update today. Add low dose ozempic for sugar and weight management.

## 2024-03-09 NOTE — Assessment & Plan Note (Signed)
 Lab Results  Component Value Date   CHOL 177 01/04/2023   HDL 55.80 01/04/2023   LDLCALC 89 01/04/2023   TRIG 160.0 (H) 01/04/2023   CHOLHDL 3 01/04/2023  Due for follow up lipid panel. Continue atorvastatin .

## 2024-03-10 ENCOUNTER — Ambulatory Visit: Payer: Self-pay | Admitting: Family

## 2024-03-10 LAB — BASIC METABOLIC PANEL WITH GFR
BUN: 13 mg/dL (ref 7–25)
CO2: 27 mmol/L (ref 20–32)
Calcium: 10.3 mg/dL (ref 8.6–10.4)
Chloride: 104 mmol/L (ref 98–110)
Creat: 0.82 mg/dL (ref 0.50–1.03)
Glucose, Bld: 86 mg/dL (ref 65–99)
Potassium: 4.1 mmol/L (ref 3.5–5.3)
Sodium: 141 mmol/L (ref 135–146)
eGFR: 83 mL/min/1.73m2 (ref >=60)

## 2024-03-10 LAB — LIPID PANEL
Cholesterol: 168 mg/dL (ref <200)
HDL: 67 mg/dL (ref >=50)
LDL Cholesterol (Calc): 81 mg/dL
Non-HDL Cholesterol (Calc): 101 mg/dL (ref <130)
Total CHOL/HDL Ratio: 2.5 (calc) (ref <5.0)
Triglycerides: 107 mg/dL (ref <150)

## 2024-03-10 LAB — HEMOGLOBIN A1C
Hgb A1c MFr Bld: 6.6 % — ABNORMAL HIGH (ref <5.7)
Mean Plasma Glucose: 143 mg/dL
eAG (mmol/L): 7.9 mmol/L

## 2024-03-10 LAB — MICROALBUMIN / CREATININE URINE RATIO
Creatinine, Urine: 73 mg/dL (ref 20–275)
Microalb Creat Ratio: 8 mg/g{creat} (ref <30)
Microalb, Ur: 0.6 mg/dL

## 2024-03-10 LAB — SPECIMEN COMPROMISED

## 2024-03-12 ENCOUNTER — Telehealth: Payer: Self-pay

## 2024-03-12 ENCOUNTER — Other Ambulatory Visit (HOSPITAL_COMMUNITY): Payer: Self-pay

## 2024-03-12 NOTE — Telephone Encounter (Signed)
 Pharmacy Patient Advocate Encounter   Received notification from CoverMyMeds that prior authorization for Ozempic  2 is required/requested.   Insurance verification completed.   The patient is insured through [facility] .   Per test claim: PA required; PA submitted to above mentioned insurance via CoverMyMeds Key/confirmation #/EOC [id number] Status is pending

## 2024-03-12 NOTE — Telephone Encounter (Signed)
 Pharmacy Patient Advocate Encounter  Received notification from [facility] that Prior Authorization for Ozempic  2 has been APPROVED from 03/12/24 to 03/12/25. Ran test claim, Copay is $4.00. This test claim was processed through [facility]- copay amounts may vary at other pharmacies due to pharmacy/plan contracts, or as the patient moves through the different stages of their insurance plan.   PA #/Case ID/Reference #: [id number]

## 2024-03-15 ENCOUNTER — Ambulatory Visit: Admitting: Family

## 2024-03-31 ENCOUNTER — Ambulatory Visit (HOSPITAL_BASED_OUTPATIENT_CLINIC_OR_DEPARTMENT_OTHER)
Admission: RE | Admit: 2024-03-31 | Discharge: 2024-03-31 | Disposition: A | Discharge status: Home / Self Care | Source: Ambulatory Visit | Attending: Family | Admitting: Family

## 2024-03-31 DIAGNOSIS — E041 Nontoxic single thyroid nodule: Secondary | ICD-10-CM | POA: Insufficient documentation

## 2024-04-27 ENCOUNTER — Encounter: Payer: Self-pay | Admitting: Family

## 2024-04-27 DIAGNOSIS — E119 Type 2 diabetes mellitus without complications: Secondary | ICD-10-CM

## 2024-04-30 MED ORDER — OZEMPIC (0.25 OR 0.5 MG/DOSE) 2 MG/1.5ML ~~LOC~~ SOPN: 0.5000 mg | PEN_INJECTOR | SUBCUTANEOUS | 2 refills | Status: DC

## 2024-06-10 ENCOUNTER — Encounter: Payer: Self-pay | Admitting: Family

## 2024-06-10 DIAGNOSIS — I10 Essential (primary) hypertension: Secondary | ICD-10-CM

## 2024-06-10 DIAGNOSIS — E785 Hyperlipidemia, unspecified: Secondary | ICD-10-CM

## 2024-06-11 MED ORDER — ATORVASTATIN CALCIUM 10 MG PO TABS: 10.0000 mg | ORAL_TABLET | Freq: Every day | ORAL | 0 refills | Status: AC

## 2024-06-11 MED ORDER — AMLODIPINE BESYLATE 5 MG PO TABS: 5.0000 mg | ORAL_TABLET | Freq: Every day | ORAL | 0 refills | Status: AC

## 2024-06-11 MED ORDER — LOSARTAN POTASSIUM 50 MG PO TABS: 50.0000 mg | ORAL_TABLET | Freq: Every day | ORAL | 0 refills | Status: AC

## 2024-08-08 ENCOUNTER — Inpatient Hospital Stay: Admission: RE | Admit: 2024-08-08 | Discharge: 2024-08-08 | Attending: Family | Admitting: Family

## 2024-08-08 DIAGNOSIS — N6325 Unspecified lump in the left breast, overlapping quadrants: Secondary | ICD-10-CM | POA: Diagnosis not present

## 2024-08-08 DIAGNOSIS — N6320 Unspecified lump in the left breast, unspecified quadrant: Secondary | ICD-10-CM

## 2024-08-08 DIAGNOSIS — R928 Other abnormal and inconclusive findings on diagnostic imaging of breast: Secondary | ICD-10-CM | POA: Diagnosis not present

## 2024-08-26 ENCOUNTER — Other Ambulatory Visit: Payer: Self-pay | Admitting: Family

## 2024-08-26 DIAGNOSIS — E119 Type 2 diabetes mellitus without complications: Secondary | ICD-10-CM

## 2024-08-27 NOTE — Telephone Encounter (Signed)
 Patient overdue for follow up.  Please contact pt to schedule a follow up visit. We can discuss her Ozempic  refill at that time.

## 2024-08-28 MED ORDER — OZEMPIC (0.25 OR 0.5 MG/DOSE) 2 MG/1.5ML ~~LOC~~ SOPN: 0.5000 mg | PEN_INJECTOR | SUBCUTANEOUS | 0 refills | Status: AC

## 2024-09-21 ENCOUNTER — Ambulatory Visit: Admitting: Family

## 2024-10-26 ENCOUNTER — Ambulatory Visit: Admitting: Family
